# Patient Record
Sex: Male | Born: 1935 | Race: White | Hispanic: No | Smoking: Former smoker
Health system: Southern US, Community
[De-identification: ages and names within clinical notes are randomized; demographics above are authoritative.]

## PROBLEM LIST (undated history)

## (undated) DIAGNOSIS — I70209 Unspecified atherosclerosis of native arteries of extremities, unspecified extremity: Secondary | ICD-10-CM

## (undated) DIAGNOSIS — Z87891 Personal history of nicotine dependence: Secondary | ICD-10-CM

## (undated) DIAGNOSIS — I10 Essential (primary) hypertension: Secondary | ICD-10-CM

## (undated) DIAGNOSIS — I6529 Occlusion and stenosis of unspecified carotid artery: Secondary | ICD-10-CM

## (undated) DIAGNOSIS — R9431 Abnormal electrocardiogram [ECG] [EKG]: Secondary | ICD-10-CM

## (undated) DIAGNOSIS — N400 Enlarged prostate without lower urinary tract symptoms: Secondary | ICD-10-CM

## (undated) DIAGNOSIS — I4510 Unspecified right bundle-branch block: Secondary | ICD-10-CM

## (undated) DIAGNOSIS — I350 Nonrheumatic aortic (valve) stenosis: Secondary | ICD-10-CM

## (undated) DIAGNOSIS — S83289A Other tear of lateral meniscus, current injury, unspecified knee, initial encounter: Secondary | ICD-10-CM

## (undated) DIAGNOSIS — E7800 Pure hypercholesterolemia, unspecified: Secondary | ICD-10-CM

## (undated) DIAGNOSIS — D72819 Decreased white blood cell count, unspecified: Secondary | ICD-10-CM

## (undated) DIAGNOSIS — E785 Hyperlipidemia, unspecified: Secondary | ICD-10-CM

## (undated) DIAGNOSIS — N200 Calculus of kidney: Secondary | ICD-10-CM

## (undated) DIAGNOSIS — I351 Nonrheumatic aortic (valve) insufficiency: Secondary | ICD-10-CM

## (undated) DIAGNOSIS — R011 Cardiac murmur, unspecified: Secondary | ICD-10-CM

## (undated) HISTORY — DX: Benign prostatic hyperplasia without lower urinary tract symptoms: N40.0

## (undated) HISTORY — DX: Calculus of kidney: N20.0

## (undated) HISTORY — DX: Cardiac murmur, unspecified: R01.1

## (undated) HISTORY — DX: Nonrheumatic aortic (valve) insufficiency: I35.1

## (undated) HISTORY — DX: Unspecified right bundle-branch block: I45.10

## (undated) HISTORY — DX: Pure hypercholesterolemia, unspecified: E78.00

## (undated) HISTORY — DX: Nonrheumatic aortic (valve) stenosis: I35.0

## (undated) HISTORY — DX: Hyperlipidemia, unspecified: E78.5

## (undated) HISTORY — DX: Other tear of lateral meniscus, current injury, unspecified knee, initial encounter: S83.289A

## (undated) HISTORY — DX: Abnormal electrocardiogram (ECG) (EKG): R94.31

## (undated) HISTORY — DX: Decreased white blood cell count, unspecified: D72.819

## (undated) HISTORY — DX: Occlusion and stenosis of unspecified carotid artery: I65.29

## (undated) HISTORY — DX: Personal history of nicotine dependence: Z87.891

## (undated) HISTORY — DX: Unspecified atherosclerosis of native arteries of extremities, unspecified extremity: I70.209

---

## 2002-05-22 ENCOUNTER — Emergency Department (HOSPITAL_COMMUNITY): Admission: EM | Admit: 2002-05-22 | Discharge: 2002-05-22 | Payer: Self-pay | Admitting: Emergency Medicine

## 2002-05-22 ENCOUNTER — Encounter: Payer: Self-pay | Admitting: Emergency Medicine

## 2013-01-07 ENCOUNTER — Other Ambulatory Visit: Payer: Self-pay | Admitting: Endocrinology

## 2013-01-12 ENCOUNTER — Ambulatory Visit
Admission: RE | Admit: 2013-01-12 | Discharge: 2013-01-12 | Disposition: A | Discharge status: Home / Self Care | Payer: Medicare Other | Source: Ambulatory Visit | Attending: Endocrinology | Admitting: Endocrinology

## 2015-03-07 DIAGNOSIS — E7800 Pure hypercholesterolemia, unspecified: Secondary | ICD-10-CM | POA: Diagnosis not present

## 2015-03-07 DIAGNOSIS — Z0000 Encounter for general adult medical examination without abnormal findings: Secondary | ICD-10-CM | POA: Diagnosis not present

## 2015-03-07 DIAGNOSIS — Z125 Encounter for screening for malignant neoplasm of prostate: Secondary | ICD-10-CM | POA: Diagnosis not present

## 2015-03-10 DIAGNOSIS — I6529 Occlusion and stenosis of unspecified carotid artery: Secondary | ICD-10-CM | POA: Diagnosis not present

## 2015-03-10 DIAGNOSIS — I351 Nonrheumatic aortic (valve) insufficiency: Secondary | ICD-10-CM | POA: Diagnosis not present

## 2015-03-10 DIAGNOSIS — Z7982 Long term (current) use of aspirin: Secondary | ICD-10-CM | POA: Diagnosis not present

## 2015-03-10 DIAGNOSIS — D72819 Decreased white blood cell count, unspecified: Secondary | ICD-10-CM | POA: Diagnosis not present

## 2015-03-10 DIAGNOSIS — R0989 Other specified symptoms and signs involving the circulatory and respiratory systems: Secondary | ICD-10-CM | POA: Diagnosis not present

## 2015-03-29 ENCOUNTER — Other Ambulatory Visit: Payer: Self-pay | Admitting: Internal Medicine

## 2015-03-29 DIAGNOSIS — I6529 Occlusion and stenosis of unspecified carotid artery: Secondary | ICD-10-CM

## 2015-04-12 DIAGNOSIS — I6529 Occlusion and stenosis of unspecified carotid artery: Secondary | ICD-10-CM | POA: Diagnosis not present

## 2015-04-12 DIAGNOSIS — E7800 Pure hypercholesterolemia, unspecified: Secondary | ICD-10-CM | POA: Diagnosis not present

## 2015-04-12 DIAGNOSIS — I358 Other nonrheumatic aortic valve disorders: Secondary | ICD-10-CM | POA: Diagnosis not present

## 2015-04-20 ENCOUNTER — Other Ambulatory Visit: Payer: Self-pay

## 2015-04-21 DIAGNOSIS — I358 Other nonrheumatic aortic valve disorders: Secondary | ICD-10-CM | POA: Diagnosis not present

## 2015-04-25 DIAGNOSIS — E7800 Pure hypercholesterolemia, unspecified: Secondary | ICD-10-CM | POA: Diagnosis not present

## 2015-04-25 DIAGNOSIS — I2510 Atherosclerotic heart disease of native coronary artery without angina pectoris: Secondary | ICD-10-CM | POA: Diagnosis not present

## 2015-05-03 DIAGNOSIS — I6529 Occlusion and stenosis of unspecified carotid artery: Secondary | ICD-10-CM | POA: Diagnosis not present

## 2015-05-03 DIAGNOSIS — I351 Nonrheumatic aortic (valve) insufficiency: Secondary | ICD-10-CM | POA: Diagnosis not present

## 2015-05-03 DIAGNOSIS — E7800 Pure hypercholesterolemia, unspecified: Secondary | ICD-10-CM | POA: Diagnosis not present

## 2015-05-03 DIAGNOSIS — I350 Nonrheumatic aortic (valve) stenosis: Secondary | ICD-10-CM | POA: Diagnosis not present

## 2015-05-26 IMAGING — US US CAROTID DUPLEX BILAT
1 series · 13 of 24 positions shown · non-contrast
Comparison: None.

CLINICAL DATA: Carotid atherosclerosis

EXAM:
BILATERAL CAROTID DUPLEX ULTRASOUND
TECHNIQUE: Gray scale imaging, color Doppler and duplex ultrasound were
performed of bilateral carotid and vertebral arteries in the neck.

[Series 1: us carotid duplex bilat · 0.07mm/px · 13 of 71 slices shown]
[im 1/71]
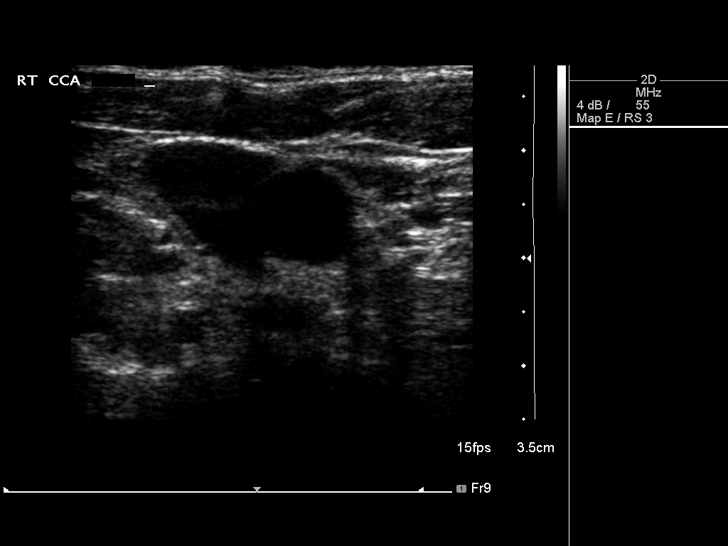
[im 7/71]
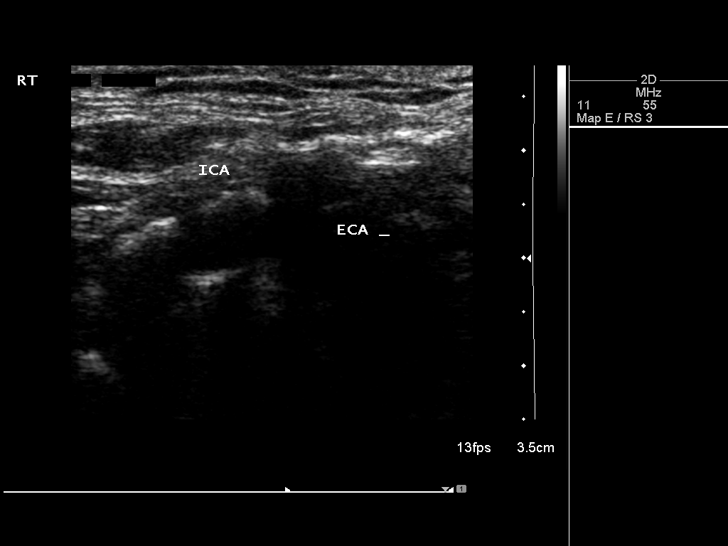
[im 13/71]
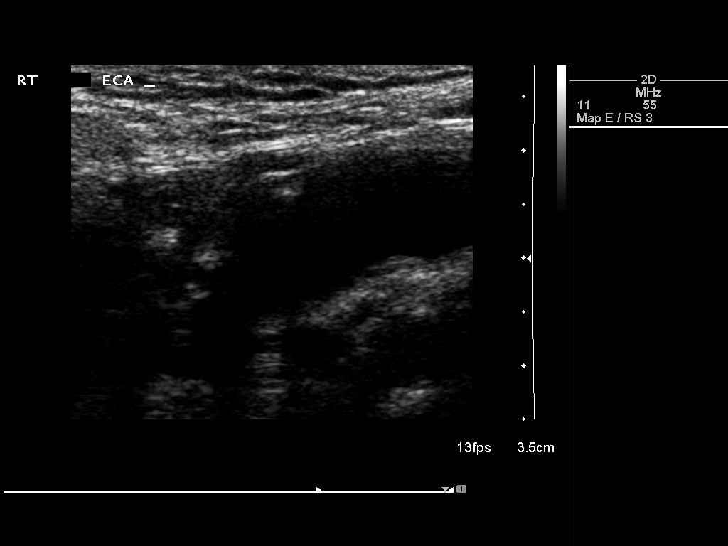
[im 19/71]
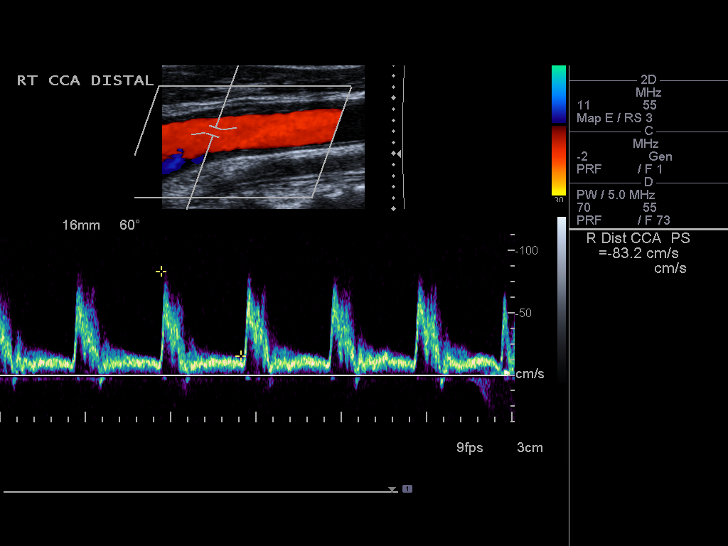
[im 25/71]
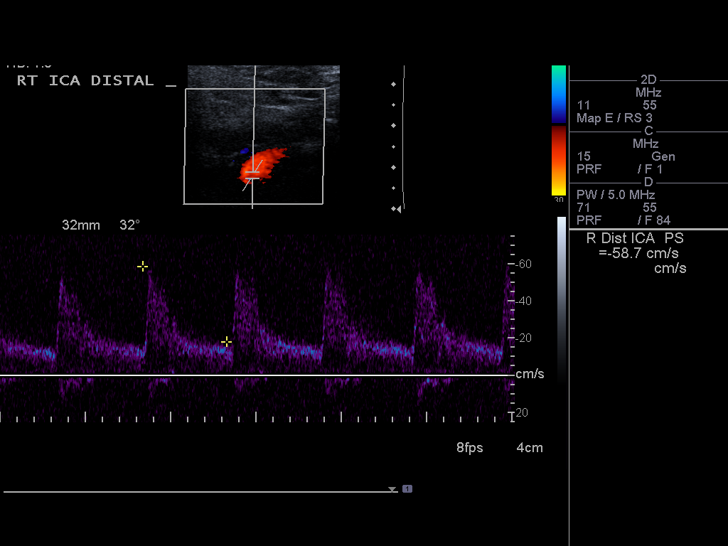
[im 31/71]
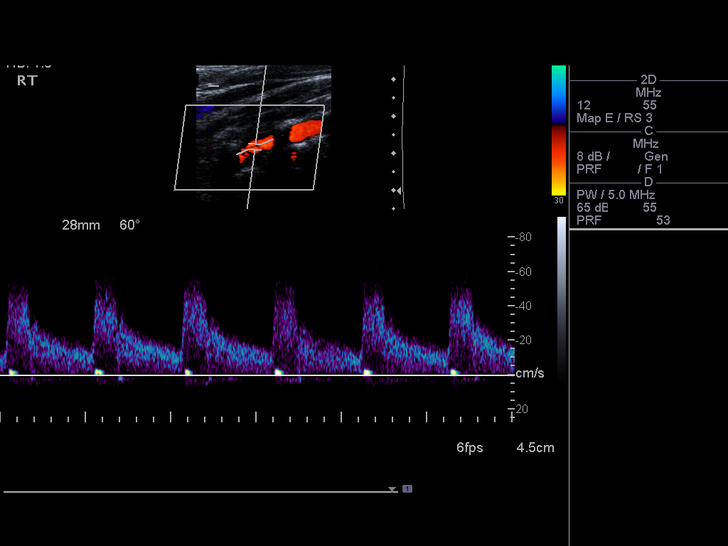
[im 37/71]
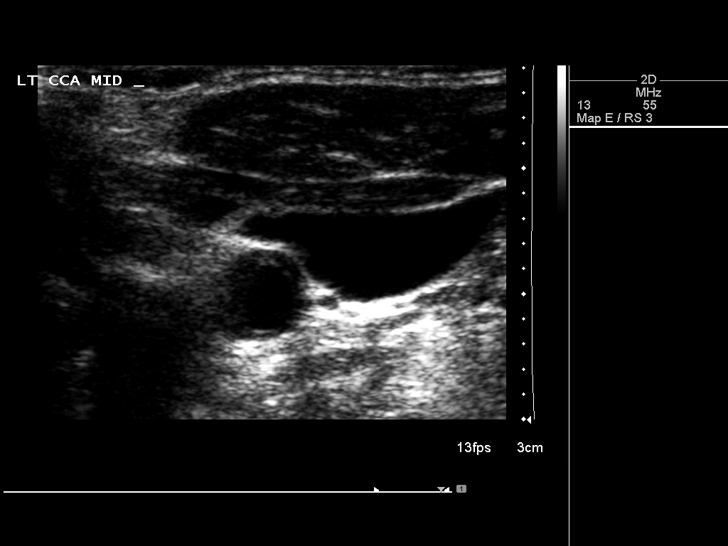
[im 40/71]
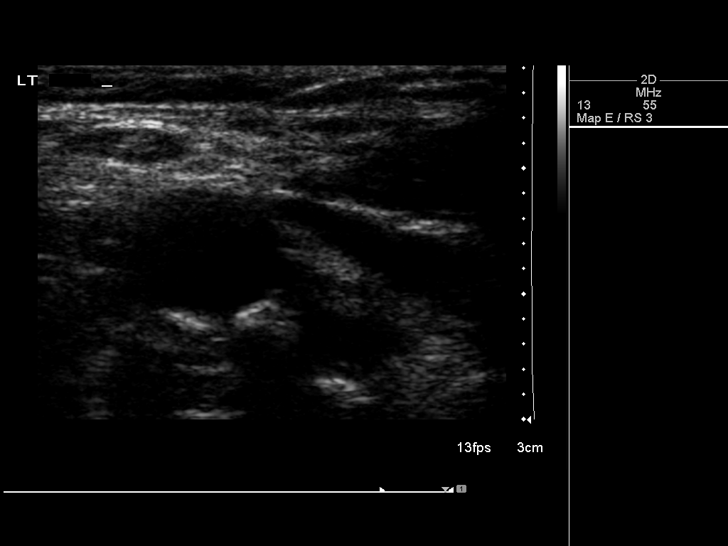
[im 46/71]
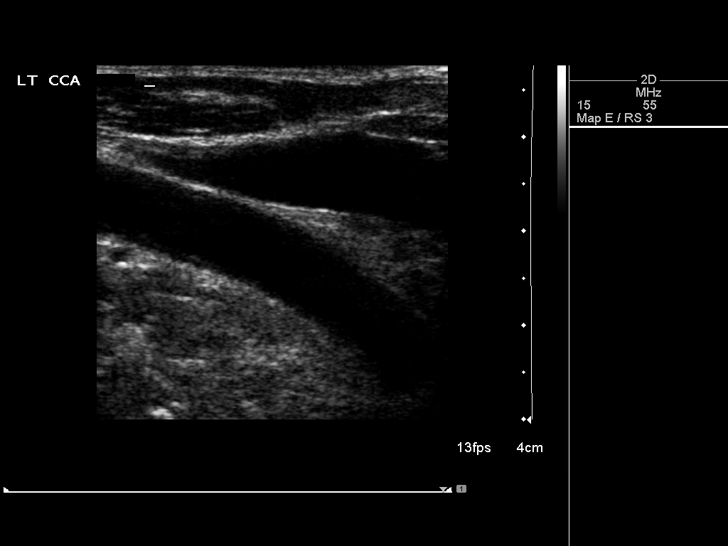
[im 52/71]
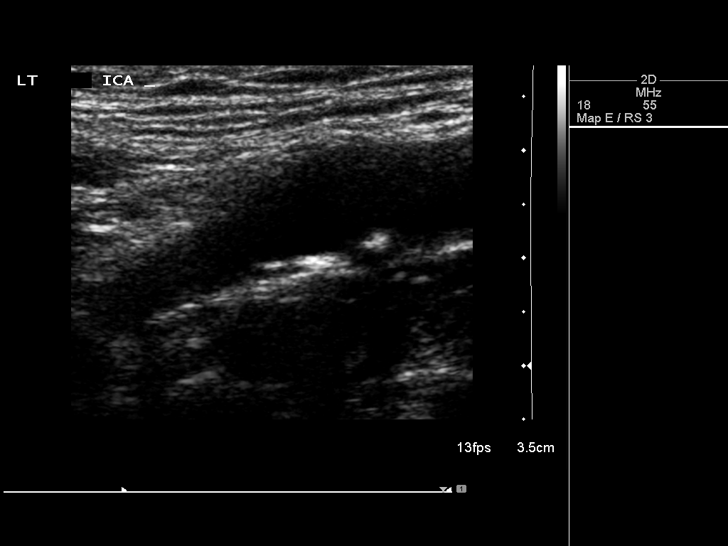
[im 58/71]
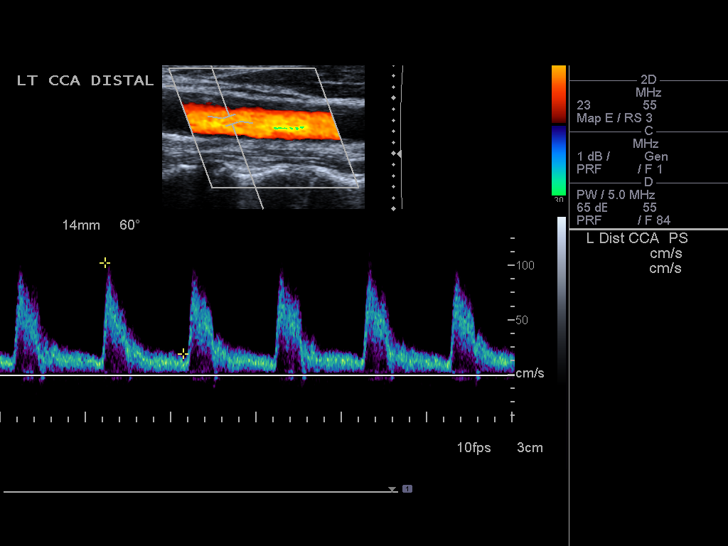
[im 64/71]
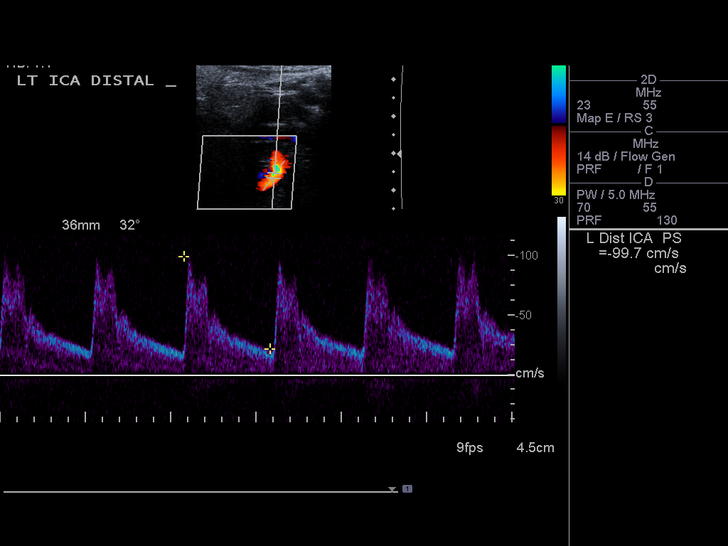
[im 71/71]
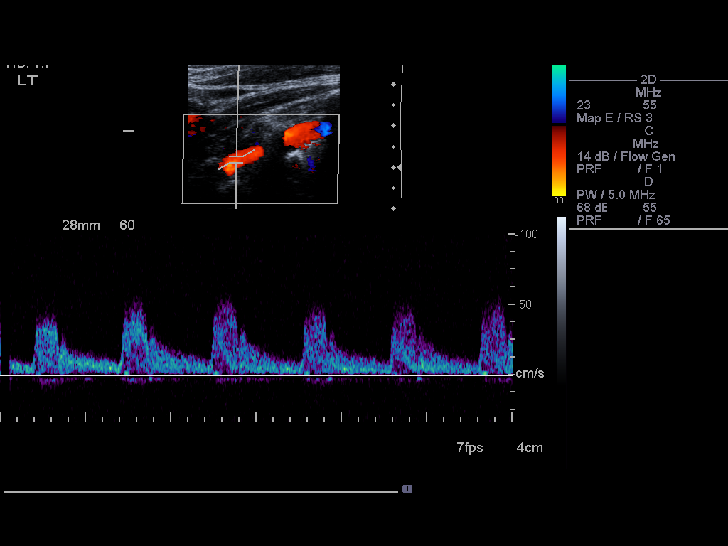

[13 of 24 positions shown; findings below may reference images not displayed]

FINDINGS: Criteria: Quantification of carotid stenosis is based on velocity
parameters that correlate the residual internal carotid diameter
with NASCET-based stenosis levels, using the diameter of the distal
internal carotid lumen as the denominator for stenosis measurement.

The following velocity measurements were obtained:

RIGHT

ICA:  106 cm/sec

CCA:  104 cm/sec

SYSTOLIC ICA/CCA RATIO:

DIASTOLIC ICA/CCA RATIO:

ECA:  99 cm/sec

LEFT

ICA:  105 cm/sec

CCA:  103 cm/sec

SYSTOLIC ICA/CCA RATIO:

DIASTOLIC ICA/CCA RATIO:

ECA:  129 cm/sec

RIGHT CAROTID ARTERY: Little if any plaque in the bulb. Low
resistance internal carotid Doppler pattern.

RIGHT VERTEBRAL ARTERY:  Antegrade. Low resistance Doppler pattern.

LEFT CAROTID ARTERY: Minimal calcified plaque in the bulb. Low
resistance internal carotid Doppler pattern.

LEFT VERTEBRAL ARTERY:  Antegrade. Normal Doppler waveform.
IMPRESSION: Less than 50% stenosis in the right and left internal carotid
arteries.

## 2015-12-02 DIAGNOSIS — I10 Essential (primary) hypertension: Secondary | ICD-10-CM | POA: Diagnosis not present

## 2015-12-02 DIAGNOSIS — E7800 Pure hypercholesterolemia, unspecified: Secondary | ICD-10-CM | POA: Diagnosis not present

## 2015-12-02 DIAGNOSIS — I6529 Occlusion and stenosis of unspecified carotid artery: Secondary | ICD-10-CM | POA: Diagnosis not present

## 2015-12-02 DIAGNOSIS — I350 Nonrheumatic aortic (valve) stenosis: Secondary | ICD-10-CM | POA: Diagnosis not present

## 2016-03-14 DIAGNOSIS — Z7982 Long term (current) use of aspirin: Secondary | ICD-10-CM | POA: Diagnosis not present

## 2016-03-14 DIAGNOSIS — Z125 Encounter for screening for malignant neoplasm of prostate: Secondary | ICD-10-CM | POA: Diagnosis not present

## 2016-03-14 DIAGNOSIS — D72819 Decreased white blood cell count, unspecified: Secondary | ICD-10-CM | POA: Diagnosis not present

## 2016-03-14 DIAGNOSIS — E7800 Pure hypercholesterolemia, unspecified: Secondary | ICD-10-CM | POA: Diagnosis not present

## 2016-03-15 DIAGNOSIS — N400 Enlarged prostate without lower urinary tract symptoms: Secondary | ICD-10-CM | POA: Diagnosis not present

## 2016-03-15 DIAGNOSIS — E7800 Pure hypercholesterolemia, unspecified: Secondary | ICD-10-CM | POA: Diagnosis not present

## 2016-03-15 DIAGNOSIS — Z7982 Long term (current) use of aspirin: Secondary | ICD-10-CM | POA: Diagnosis not present

## 2016-03-15 DIAGNOSIS — Z0001 Encounter for general adult medical examination with abnormal findings: Secondary | ICD-10-CM | POA: Diagnosis not present

## 2016-05-22 ENCOUNTER — Emergency Department (HOSPITAL_COMMUNITY)
Admission: EM | Admit: 2016-05-22 | Discharge: 2016-05-22 | Disposition: A | Discharge status: Home / Self Care | Payer: Medicare Other | Attending: Emergency Medicine | Admitting: Emergency Medicine

## 2016-05-22 ENCOUNTER — Encounter (HOSPITAL_COMMUNITY): Payer: Self-pay | Admitting: Emergency Medicine

## 2016-05-22 DIAGNOSIS — R404 Transient alteration of awareness: Secondary | ICD-10-CM | POA: Diagnosis not present

## 2016-05-22 DIAGNOSIS — Z7982 Long term (current) use of aspirin: Secondary | ICD-10-CM | POA: Diagnosis not present

## 2016-05-22 DIAGNOSIS — I6529 Occlusion and stenosis of unspecified carotid artery: Secondary | ICD-10-CM | POA: Diagnosis not present

## 2016-05-22 DIAGNOSIS — Z87891 Personal history of nicotine dependence: Secondary | ICD-10-CM | POA: Diagnosis not present

## 2016-05-22 DIAGNOSIS — I350 Nonrheumatic aortic (valve) stenosis: Secondary | ICD-10-CM | POA: Diagnosis not present

## 2016-05-22 DIAGNOSIS — I10 Essential (primary) hypertension: Secondary | ICD-10-CM | POA: Insufficient documentation

## 2016-05-22 DIAGNOSIS — R55 Syncope and collapse: Secondary | ICD-10-CM | POA: Diagnosis not present

## 2016-05-22 DIAGNOSIS — I351 Nonrheumatic aortic (valve) insufficiency: Secondary | ICD-10-CM | POA: Diagnosis not present

## 2016-05-22 HISTORY — DX: Essential (primary) hypertension: I10

## 2016-05-22 LAB — I-STAT CHEM 8, ED
BUN: 25 mg/dL — ABNORMAL HIGH (ref 6–20)
Calcium, Ion: 1.09 mmol/L — ABNORMAL LOW (ref 1.15–1.40)
Chloride: 101 mmol/L (ref 101–111)
Creatinine, Ser: 1.3 mg/dL — ABNORMAL HIGH (ref 0.61–1.24)
Glucose, Bld: 101 mg/dL — ABNORMAL HIGH (ref 65–99)
HCT: 39 % (ref 39.0–52.0)
Hemoglobin: 13.3 g/dL (ref 13.0–17.0)
Potassium: 4.1 mmol/L (ref 3.5–5.1)
Sodium: 138 mmol/L (ref 135–145)
TCO2: 23 mmol/L (ref 0–100)

## 2016-05-22 NOTE — ED Notes (Signed)
Meal given

## 2016-05-22 NOTE — ED Provider Notes (Signed)
[facility] DEPT Provider Note   CSN: [id number] Arrival date & time: 05/22/16  1317     History   Chief Complaint Chief Complaint  Patient presents with  . Loss of Consciousness    HPI Dwayne [name] is a 82 y.o. male.  82 yo M with a chief complaint of a syncopal event. Patient states he was standing in line waiting for the data transferred from his old wound to his new phone when he suddenly felt like the room was closing in on him and he collapsed to the ground. He denies any injury in the fall. Now he feels fine. Was initially hypotensive on the scene. Patient denies history of heart failure denies recent illness. Denies chest pain shortness of breath headache.   The history is provided by the patient.  Loss of Consciousness   This is a new problem. The current episode started less than 1 hour ago. The problem occurs constantly. He lost consciousness for a period of less than one minute. The problem is associated with normal activity. Pertinent negatives include abdominal pain, chest pain, confusion, congestion, fever, headaches, palpitations and vomiting. He has tried nothing for the symptoms. The treatment provided no relief.    Past Medical History:  Diagnosis Date  . Hypertension     There are no active problems to display for this patient.   History reviewed. No pertinent surgical history.     Home Medications    Prior to Admission medications   Medication Sig Start Date End Date Taking? Authorizing Provider  aspirin 81 MG chewable tablet Chew 81 mg by mouth daily.   Yes Historical Provider, MD  lisinopril (PRINIVIL,ZESTRIL) 5 MG tablet Take 5 mg by mouth daily.   Yes Historical Provider, MD    Family History No family history on file.  Social History Social History  Substance Use Topics  . Smoking status: Former Smoker    Quit date: 05/22/1968  . Smokeless tobacco: Current User    Types: Chew  . Alcohol use No     Allergies   Patient has no  known allergies.   Review of Systems Review of Systems  Constitutional: Negative for chills and fever.  HENT: Negative for congestion and facial swelling.   Eyes: Negative for discharge and visual disturbance.  Respiratory: Negative for shortness of breath.   Cardiovascular: Positive for syncope. Negative for chest pain and palpitations.  Gastrointestinal: Negative for abdominal pain, diarrhea and vomiting.  Musculoskeletal: Negative for arthralgias and myalgias.  Skin: Negative for color change and rash.  Neurological: Positive for syncope. Negative for tremors and headaches.  Psychiatric/Behavioral: Negative for confusion and dysphoric mood.     Physical Exam Updated Vital Signs BP 128/67   Pulse 66   Temp 98.1 F (36.7 C) (Oral)   Resp 18   Ht 5\' 11"  (1.803 m)   Wt 175 lb (79.4 kg)   SpO2 97%   BMI 24.41 kg/m   Physical Exam  Constitutional: He is oriented to person, place, and time. He appears well-developed and well-nourished.  HENT:  Head: Normocephalic and atraumatic.  Eyes: EOM are normal. Pupils are equal, round, and reactive to light.  Neck: Normal range of motion. Neck supple. No JVD present.  Cardiovascular: Normal rate and regular rhythm.  Exam reveals no gallop and no friction rub.   Murmur (systolic ejection murmur best heard at the mitral pole) heard. Pulmonary/Chest: No respiratory distress. He has no wheezes.  Abdominal: He exhibits no distension and no mass.  There is no tenderness. There is no rebound and no guarding.  Musculoskeletal: Normal range of motion.  Neurological: He is alert and oriented to person, place, and time.  Skin: No rash noted. No pallor.  Psychiatric: He has a normal mood and affect. His behavior is normal.  Nursing note and vitals reviewed.    ED Treatments / Results  Labs (all labs ordered are listed, but only abnormal results are displayed) Labs Reviewed  I-STAT CHEM 8, ED - Abnormal; Notable for the following:        Result Value   BUN 25 (*)    Creatinine, Ser 1.30 (*)    Glucose, Bld 101 (*)    Calcium, Ion 1.09 (*)    All other components within normal limits    EKG  EKG Interpretation None       Radiology No results found.  Procedures Procedures (including critical care time)  Medications Ordered in ED Medications - No data to display   Initial Impression / Assessment and Plan / ED Course  I have reviewed the triage vital signs and the nursing notes.  Pertinent labs & imaging results that were available during my care of the patient were reviewed by me and considered in my medical decision making (see chart for details).     82 yo M With a chief complaint of a syncopal event. Vasovagal by history. Patient incidentally had a echocardiogram done today. I discussed this with Dr. Einar Gip, there was able to look at it and has no change in his degree of aortic stenosis. As this was not exertional and did not feel that he needs admission to the hospital for emergent repair. I discussed this with the patient. He will follow-up with his cardiologist.  4:40 PM:  I have discussed the diagnosis/risks/treatment options with the patient and family and believe the pt to be eligible for discharge home to follow-up with Cards. We also discussed returning to the ED immediately if new or worsening sx occur. We discussed the sx which are most concerning (e.g., recurrent event, chest pain or shortness breath on exertion.) that necessitate immediate return. Medications administered to the patient during their visit and any new prescriptions provided to the patient are listed below.  Medications given during this visit Medications - No data to display   The patient appears reasonably screen and/or stabilized for discharge and I doubt any other medical condition or other [facility] requiring further screening, evaluation, or treatment in the ED at this time prior to discharge.    Final Clinical Impressions(s) / ED  Diagnoses   Final diagnoses:  Syncope and collapse    New Prescriptions Discharge Medication List as of 05/22/2016  3:46 PM       Deno Etienne, DO 05/22/16 1641

## 2016-05-22 NOTE — Discharge Instructions (Signed)
Follow up with your cardiologist  

## 2016-06-12 DIAGNOSIS — I6529 Occlusion and stenosis of unspecified carotid artery: Secondary | ICD-10-CM | POA: Diagnosis not present

## 2016-06-12 DIAGNOSIS — I10 Essential (primary) hypertension: Secondary | ICD-10-CM | POA: Diagnosis not present

## 2016-06-12 DIAGNOSIS — I350 Nonrheumatic aortic (valve) stenosis: Secondary | ICD-10-CM | POA: Diagnosis not present

## 2016-06-12 DIAGNOSIS — E7800 Pure hypercholesterolemia, unspecified: Secondary | ICD-10-CM | POA: Diagnosis not present

## 2017-03-15 DIAGNOSIS — E7800 Pure hypercholesterolemia, unspecified: Secondary | ICD-10-CM | POA: Diagnosis not present

## 2017-03-15 DIAGNOSIS — Z0000 Encounter for general adult medical examination without abnormal findings: Secondary | ICD-10-CM | POA: Diagnosis not present

## 2017-03-20 DIAGNOSIS — I351 Nonrheumatic aortic (valve) insufficiency: Secondary | ICD-10-CM | POA: Diagnosis not present

## 2017-03-20 DIAGNOSIS — N183 Chronic kidney disease, stage 3 (moderate): Secondary | ICD-10-CM | POA: Diagnosis not present

## 2017-03-20 DIAGNOSIS — N400 Enlarged prostate without lower urinary tract symptoms: Secondary | ICD-10-CM | POA: Diagnosis not present

## 2017-03-20 DIAGNOSIS — R0989 Other specified symptoms and signs involving the circulatory and respiratory systems: Secondary | ICD-10-CM | POA: Diagnosis not present

## 2017-03-20 DIAGNOSIS — Z0001 Encounter for general adult medical examination with abnormal findings: Secondary | ICD-10-CM | POA: Diagnosis not present

## 2017-03-20 DIAGNOSIS — E7800 Pure hypercholesterolemia, unspecified: Secondary | ICD-10-CM | POA: Diagnosis not present

## 2017-03-20 DIAGNOSIS — I6529 Occlusion and stenosis of unspecified carotid artery: Secondary | ICD-10-CM | POA: Diagnosis not present

## 2017-03-20 DIAGNOSIS — Z1212 Encounter for screening for malignant neoplasm of rectum: Secondary | ICD-10-CM | POA: Diagnosis not present

## 2017-07-09 DIAGNOSIS — I6529 Occlusion and stenosis of unspecified carotid artery: Secondary | ICD-10-CM | POA: Diagnosis not present

## 2017-07-09 DIAGNOSIS — E7800 Pure hypercholesterolemia, unspecified: Secondary | ICD-10-CM | POA: Diagnosis not present

## 2017-07-09 DIAGNOSIS — I10 Essential (primary) hypertension: Secondary | ICD-10-CM | POA: Diagnosis not present

## 2017-07-09 DIAGNOSIS — I350 Nonrheumatic aortic (valve) stenosis: Secondary | ICD-10-CM | POA: Diagnosis not present

## 2017-08-01 DIAGNOSIS — I350 Nonrheumatic aortic (valve) stenosis: Secondary | ICD-10-CM | POA: Diagnosis not present

## 2017-08-01 DIAGNOSIS — I351 Nonrheumatic aortic (valve) insufficiency: Secondary | ICD-10-CM | POA: Diagnosis not present

## 2018-01-07 DIAGNOSIS — I350 Nonrheumatic aortic (valve) stenosis: Secondary | ICD-10-CM | POA: Diagnosis not present

## 2018-01-07 DIAGNOSIS — I10 Essential (primary) hypertension: Secondary | ICD-10-CM | POA: Diagnosis not present

## 2018-01-07 DIAGNOSIS — I6529 Occlusion and stenosis of unspecified carotid artery: Secondary | ICD-10-CM | POA: Diagnosis not present

## 2018-01-07 DIAGNOSIS — E7800 Pure hypercholesterolemia, unspecified: Secondary | ICD-10-CM | POA: Diagnosis not present

## 2018-05-22 ENCOUNTER — Other Ambulatory Visit: Payer: Self-pay | Admitting: Cardiology

## 2018-05-22 DIAGNOSIS — I350 Nonrheumatic aortic (valve) stenosis: Secondary | ICD-10-CM

## 2018-05-22 DIAGNOSIS — E7800 Pure hypercholesterolemia, unspecified: Secondary | ICD-10-CM | POA: Diagnosis not present

## 2018-05-22 DIAGNOSIS — Z7982 Long term (current) use of aspirin: Secondary | ICD-10-CM | POA: Diagnosis not present

## 2018-05-22 DIAGNOSIS — I351 Nonrheumatic aortic (valve) insufficiency: Secondary | ICD-10-CM

## 2018-05-29 DIAGNOSIS — Z0000 Encounter for general adult medical examination without abnormal findings: Secondary | ICD-10-CM | POA: Diagnosis not present

## 2018-05-29 DIAGNOSIS — I517 Cardiomegaly: Secondary | ICD-10-CM | POA: Diagnosis not present

## 2018-06-06 ENCOUNTER — Encounter: Payer: Self-pay | Admitting: Gastroenterology

## 2018-07-07 ENCOUNTER — Ambulatory Visit: Payer: Self-pay | Admitting: Gastroenterology

## 2018-07-08 ENCOUNTER — Other Ambulatory Visit: Payer: Self-pay

## 2018-07-14 ENCOUNTER — Ambulatory Visit: Payer: Self-pay | Admitting: Cardiology

## 2018-08-07 ENCOUNTER — Other Ambulatory Visit: Payer: Medicare Other

## 2018-08-19 ENCOUNTER — Ambulatory Visit: Payer: Self-pay | Admitting: Cardiology

## 2018-09-24 ENCOUNTER — Ambulatory Visit (INDEPENDENT_AMBULATORY_CARE_PROVIDER_SITE_OTHER): Payer: Medicare Other

## 2018-09-24 ENCOUNTER — Other Ambulatory Visit: Payer: Self-pay

## 2018-09-24 DIAGNOSIS — I351 Nonrheumatic aortic (valve) insufficiency: Secondary | ICD-10-CM | POA: Diagnosis not present

## 2018-09-24 DIAGNOSIS — I350 Nonrheumatic aortic (valve) stenosis: Secondary | ICD-10-CM

## 2018-09-29 ENCOUNTER — Ambulatory Visit (INDEPENDENT_AMBULATORY_CARE_PROVIDER_SITE_OTHER): Payer: Medicare Other | Admitting: Cardiology

## 2018-09-29 ENCOUNTER — Encounter: Payer: Self-pay | Admitting: Cardiology

## 2018-09-29 ENCOUNTER — Other Ambulatory Visit: Payer: Self-pay

## 2018-09-29 DIAGNOSIS — E7800 Pure hypercholesterolemia, unspecified: Secondary | ICD-10-CM | POA: Insufficient documentation

## 2018-09-29 DIAGNOSIS — I6523 Occlusion and stenosis of bilateral carotid arteries: Secondary | ICD-10-CM | POA: Insufficient documentation

## 2018-09-29 DIAGNOSIS — I352 Nonrheumatic aortic (valve) stenosis with insufficiency: Secondary | ICD-10-CM | POA: Insufficient documentation

## 2018-09-29 DIAGNOSIS — I119 Hypertensive heart disease without heart failure: Secondary | ICD-10-CM | POA: Diagnosis not present

## 2018-09-29 NOTE — Progress Notes (Signed)
Subjective:  Primary Physician:  Deland Pretty, MD  Patient ID: Dwayne [name], male    DOB: [birth year], 84 y.o.   MRN: [id number]  This visit type was conducted due to national recommendations for restrictions regarding the COVID-19 Pandemic (e.g. social distancing).  This format is felt to be most appropriate for this patient at this time.  All issues noted in this document were discussed and addressed.  No physical exam was performed (except for noted visual exam findings with Telehealth visits - very limited).  The patient has consented to conduct a Telehealth visit and understands insurance will be billed.   I connected with patient, on 09/29/18  by telemedicine application and verified that I am speaking with the correct person using two identifiers.     I discussed the limitations of evaluation and management by telemedicine and the availability of in person appointments. The patient expressed understanding and agreed to proceed.   I have discussed with patient regarding the safety during COVID Pandemic and steps and precautions including social distancing with the patient.   Chief Complaint  Patient presents with  . Aortic valve stenosis  . Hypertension  . Follow-up    42mo   HPI: Dwayne [name] is a 84y.o. male, who presents for a Follow-up for Valvular heart disease. He has aortic stenosis and aortic regurgitation.  Patient denies any complaints of shortness of breath, orthopnea or PND. No complaints of chest pain, tightness or pressure. No history of swelling on the legs and no claudication. No complaints of palpitation, dizziness, near-syncope or syncope. No h/o fever.  Patient has hypertension, controlled with therapy. No h/o diabetes. He has hypercholesterolemia. Patient has intolerance to statins. He had tried several statins and had severe myalgias. Currently, he is on some research study medication for high cholesterol.  Patient also has mild carotid artery  atherosclerosis, No complaints of sudden weakness, numbness on any one side of the body, no visual symptoms and no other symptoms of TIA. Patient is complaining of increased bruising but there is no history of any other bleeding. No hematuria, GI bleed or epistaxis.  No history of thyroid problems. No history of TIA or CVA. He has been staying fairly active physically doing yard work, using [employer]  Past Medical History:  Diagnosis Date  . Aortic regurgitation   . Aortic stenosis   . Benign prostatic hyperplasia   . Chronic leukopenia   . Heart murmur   . Hyperlipidemia   . Hypertension     No past surgical history on file.  Social History   Socioeconomic History  . Marital status: Widowed    Spouse name: Not on file  . Number of children: 2  . Years of education: Not on file  . Highest education level: Not on file  Occupational History  . Not on file  Social Needs  . Financial resource strain: Not on file  . Food insecurity    Worry: Not on file    Inability: Not on file  . Transportation needs    Medical: Not on file    Non-medical: Not on file  Tobacco Use  . Smoking status: Former Smoker    Packs/day: 1.00    Years: 10.00    Pack years: 10.00    Quit date: 05/22/1970    Years since quitting: 48.3  . Smokeless tobacco: Current User    Types: Chew  Substance and Sexual Activity  . Alcohol use: No  .  Drug use: No  . Sexual activity: Not on file  Lifestyle  . Physical activity    Days per week: Not on file    Minutes per session: Not on file  . Stress: Not on file  Relationships  . Social Herbalist on phone: Not on file    Gets together: Not on file    Attends religious service: Not on file    Active member of club or organization: Not on file    Attends meetings of clubs or organizations: Not on file    Relationship status: Not on file  . Intimate partner violence    Fear of current or ex partner: Not on file    Emotionally abused: Not  on file    Physically abused: Not on file    Forced sexual activity: Not on file  Other Topics Concern  . Not on file  Social History Narrative  . Not on file    Current Outpatient Medications on File Prior to Visit  Medication Sig Dispense Refill  . aspirin 81 MG EC tablet Take 81 mg by mouth daily.     Marland Kitchen lisinopril (PRINIVIL,ZESTRIL) 5 MG tablet Take 5 mg by mouth daily.     No current facility-administered medications on file prior to visit.     Review of Systems  Constitutional: Negative for fever.  HENT: Negative for nosebleeds.   Eyes: Negative for blurred vision.  Respiratory: Negative for cough and shortness of breath.   Cardiovascular: Negative for chest pain, palpitations, claudication and leg swelling.  Gastrointestinal: Negative for abdominal pain, nausea and vomiting.  Genitourinary: Negative for dysuria.  Musculoskeletal: Negative for myalgias.  Skin: Negative for itching and rash.  Neurological: Negative for dizziness, seizures and loss of consciousness.  Psychiatric/Behavioral: The patient is not nervous/anxious.        Objective:  Height 5' 11"  (1.803 m), weight 175 lb (79.4 kg). Body mass index is 24.41 kg/m.  Physical Exam: Patient is alert and oriented, appeared very comfortable talking to me during the visit. No further detailed physical examination was possible as it was a telemedicine visit.   CARDIAC STUDIES:  Echocardiogram- 09/24/2018 1. Normal LV systolic function with EF 55%. Left ventricle cavity is normal in size. Moderate concentric hypertrophy of the left ventricle. Normal global wall motion. Doppler evidence of grade I (impaired) diastolic dysfunction, normal LAP. 2. Trileaflet aortic valve with moderate leaflet and annular calcification. Moderate to severe aortic valve stenosis. Aortic valve mean gradient of 27 mmHg, Vmax of 3.4 m/s. Calculated aortic valve area by continuity equation is 0.9 cm. Moderate (Grade III) aortic  regurgitation. 3. Mild tricuspid regurgitation. 4. No evidence of pulmonary hypertension. 5. No significant change compared to previous study on 05/22/2016.  Treadmill exercise stress test 04/25/2015: Indication: Screening for CAD. The patient exercised according to Bruce Protocol, Total time recorded 3.52 min achieving max heart rate of 133 which was 93 % of MPHR for age and 5.67 METS of work. Normal BP response. Resting EKG shows NSR, RBBB. There was no ST-T changes of ischemia with exercise stress test. Stress terminated due to THR (>85% MPHR)/MPHR met. Mildly reduced exercise tolerence. No arrhythmias. Arthritis and dyspnea limiting stress test.  Carotid artery duplex 05/22/2016: Minimal stenosis in the bilateral internal carotid artery (minimal) with mild mixed plaque through out the carotid arteries bilaterally. Antegrade vertebral artery flow. No significant change comparing outside report of Carotid Doppler- 01/12/2013  Assessment & Recommendations:     ICD-10-CM  1. Nonrheumatic aortic insufficiency with aortic stenosis  I35.2   2. Hypertension with heart disease  I11.9   3. Hypercholesterolemia  E78.00   4. Mild atherosclerosis of both carotid arteries  I65.23    Laboratory Exam:  CBC Latest Ref Rng & Units 05/22/2016  Hemoglobin 13.0 - 17.0 g/dL 13.3  Hematocrit 39.0 - 52.0 % 39.0   CMP Latest Ref Rng & Units 05/22/2016  Glucose 65 - 99 mg/dL 101(H)  BUN 6 - 20 mg/dL 25(H)  Creatinine 0.61 - 1.24 mg/dL 1.30(H)  Sodium 135 - 145 mmol/L 138  Potassium 3.5 - 5.1 mmol/L 4.1  Chloride 101 - 111 mmol/L 101   Lipid Panel  No results found for: CHOL, TRIG, HDL, CHOLHDL, VLDL, LDLCALC, LDLDIRECT   Recommendation:  Valvular heart disease is stable clinically. Echocardiogram findings were explained to the patient, no significant change from last year.No symptoms or signs of CHF. Blood pressure is controlled with therapy, he was advised to continue present medications.   Primary prevention was again discussed with him. He was advised to follow low-salt, low-cholesterol diet. He will continue research study medication for cholesterol and have blood tests with them and at PCP's office. Continue low-dose aspirin.  I will see him in follow-up in 6 months but call us earlier if there are any cardiac problems or BP is uncontrolled.   Despina Hick, MD, [facility] 09/29/2018, 12:14 PM [facility] [place] Pager: [phone] Office: [phone] If no answer Cell [phone]

## 2019-03-30 ENCOUNTER — Ambulatory Visit: Payer: Medicare Other | Admitting: Cardiology

## 2019-06-04 DIAGNOSIS — N183 Chronic kidney disease, stage 3 unspecified: Secondary | ICD-10-CM | POA: Diagnosis not present

## 2019-06-04 DIAGNOSIS — Z0001 Encounter for general adult medical examination with abnormal findings: Secondary | ICD-10-CM | POA: Diagnosis not present

## 2019-07-09 ENCOUNTER — Other Ambulatory Visit: Payer: Self-pay | Admitting: Cardiology

## 2019-07-09 DIAGNOSIS — I10 Essential (primary) hypertension: Secondary | ICD-10-CM

## 2019-10-03 ENCOUNTER — Other Ambulatory Visit: Payer: Self-pay | Admitting: Cardiology

## 2019-10-03 DIAGNOSIS — I10 Essential (primary) hypertension: Secondary | ICD-10-CM

## 2020-01-03 ENCOUNTER — Other Ambulatory Visit: Payer: Self-pay | Admitting: Cardiology

## 2020-01-03 DIAGNOSIS — I10 Essential (primary) hypertension: Secondary | ICD-10-CM

## 2020-04-07 ENCOUNTER — Other Ambulatory Visit: Payer: Self-pay | Admitting: Cardiology

## 2020-04-07 DIAGNOSIS — I10 Essential (primary) hypertension: Secondary | ICD-10-CM

## 2020-06-09 DIAGNOSIS — E7800 Pure hypercholesterolemia, unspecified: Secondary | ICD-10-CM | POA: Diagnosis not present

## 2020-06-16 DIAGNOSIS — I350 Nonrheumatic aortic (valve) stenosis: Secondary | ICD-10-CM | POA: Diagnosis not present

## 2020-06-16 DIAGNOSIS — Z0001 Encounter for general adult medical examination with abnormal findings: Secondary | ICD-10-CM | POA: Diagnosis not present

## 2020-06-16 DIAGNOSIS — E7800 Pure hypercholesterolemia, unspecified: Secondary | ICD-10-CM | POA: Diagnosis not present

## 2020-06-16 DIAGNOSIS — I10 Essential (primary) hypertension: Secondary | ICD-10-CM | POA: Diagnosis not present

## 2020-06-16 DIAGNOSIS — I6529 Occlusion and stenosis of unspecified carotid artery: Secondary | ICD-10-CM | POA: Diagnosis not present

## 2020-06-16 DIAGNOSIS — R7303 Prediabetes: Secondary | ICD-10-CM | POA: Diagnosis not present

## 2020-06-16 DIAGNOSIS — N1831 Chronic kidney disease, stage 3a: Secondary | ICD-10-CM | POA: Diagnosis not present

## 2020-06-16 DIAGNOSIS — I517 Cardiomegaly: Secondary | ICD-10-CM | POA: Diagnosis not present

## 2020-08-29 DIAGNOSIS — I517 Cardiomegaly: Secondary | ICD-10-CM | POA: Diagnosis not present

## 2020-08-29 DIAGNOSIS — R9431 Abnormal electrocardiogram [ECG] [EKG]: Secondary | ICD-10-CM | POA: Diagnosis not present

## 2020-08-29 DIAGNOSIS — I4510 Unspecified right bundle-branch block: Secondary | ICD-10-CM | POA: Diagnosis not present

## 2020-08-29 DIAGNOSIS — I351 Nonrheumatic aortic (valve) insufficiency: Secondary | ICD-10-CM | POA: Diagnosis not present

## 2020-08-29 DIAGNOSIS — I350 Nonrheumatic aortic (valve) stenosis: Secondary | ICD-10-CM | POA: Diagnosis not present

## 2020-08-29 DIAGNOSIS — Z7982 Long term (current) use of aspirin: Secondary | ICD-10-CM | POA: Diagnosis not present

## 2020-08-29 DIAGNOSIS — I6529 Occlusion and stenosis of unspecified carotid artery: Secondary | ICD-10-CM | POA: Diagnosis not present

## 2020-09-01 ENCOUNTER — Telehealth: Payer: Self-pay

## 2020-09-01 NOTE — Telephone Encounter (Signed)
NOTES ON FILE FROM [facility] [phone], SENT REFERRAL TO SCHEDULING 

## 2020-09-13 ENCOUNTER — Encounter (INDEPENDENT_AMBULATORY_CARE_PROVIDER_SITE_OTHER): Payer: Self-pay

## 2020-09-13 ENCOUNTER — Ambulatory Visit (INDEPENDENT_AMBULATORY_CARE_PROVIDER_SITE_OTHER): Payer: Medicare Other | Admitting: Cardiology

## 2020-09-13 ENCOUNTER — Other Ambulatory Visit: Payer: Self-pay

## 2020-09-13 ENCOUNTER — Encounter: Payer: Self-pay | Admitting: Cardiology

## 2020-09-13 VITALS — BP 140/70 | HR 67 | Ht 71.0 in | Wt 165.0 lb

## 2020-09-13 DIAGNOSIS — I352 Nonrheumatic aortic (valve) stenosis with insufficiency: Secondary | ICD-10-CM | POA: Diagnosis not present

## 2020-09-13 DIAGNOSIS — I119 Hypertensive heart disease without heart failure: Secondary | ICD-10-CM

## 2020-09-13 NOTE — Patient Instructions (Signed)
Medication Instructions:  The current medical regimen is effective;  continue present plan and medications.  *If you need a refill on your cardiac medications before your next appointment, please call your pharmacy*  Testing/Procedures: Your physician has requested that you have an echocardiogram. Echocardiography is a painless test that uses sound waves to create images of your heart. It provides your doctor with information about the size and shape of your heart and how well your heart's chambers and valves are working. This procedure takes approximately one hour. There are no restrictions for this procedure.  Follow-Up: At [facility], you and your health needs are our priority.  As part of our continuing mission to provide you with exceptional heart care, we have created designated Provider Care Teams.  These Care Teams include your primary Cardiologist (physician) and Advanced Practice Providers (APPs -  Physician Assistants and Nurse Practitioners) who all work together to provide you with the care you need, when you need it.  We recommend signing up for the patient portal called "MyChart".  Sign up information is provided on this After Visit Summary.  MyChart is used to connect with patients for Virtual Visits (Telemedicine).  Patients are able to view lab/test results, encounter notes, upcoming appointments, etc.  Non-urgent messages can be sent to your provider as well.   To learn more about what you can do with MyChart, go to [email or web address].    Your next appointment:   12 month(s)  The format for your next appointment:   In Person  Provider:   Mark Skains, MD   Thank you for choosing [facility]!!      

## 2020-09-13 NOTE — Progress Notes (Signed)
Cardiology Office Note:    Date:  09/13/2020   ID:  Dwayne [name], DOB 14-Jul-1934, MRN [id number]  PCP:  Deland Pretty, MD   [facility] Providers Cardiologist:  None     Referring MD: Deland Pretty, MD     History of Present Illness:    Dwayne [name] is a 86 y.o. male here for the evaluation of aortic stenosis and aortic regurgitation.  Overall not having any fevers chills nausea vomiting syncope bleeding shortness of breath chest pain.  Has had problems with statin intolerance.  Severe myalgias.  Currently in research study.  Has had mild carotid artery atherosclerosis in the past.  No symptoms of stroke.and a trial with bempedoic acid  Stays active doing yard work, Visual merchandiser. he has 5 acres of manicured grass.  5 other acres of pasture land.  Enjoys his four tractors.    Past Medical History:  Diagnosis Date  . Abnormal EKG   . Acute tear lateral meniscus   . Aortic regurgitation   . Aortic stenosis   . Atherosclerotic peripheral vascular disease ([place])   . Benign prostatic hyperplasia   . BPH (benign prostatic hyperplasia)   . Carotid atherosclerosis   . Chronic leukopenia   . Heart murmur   . History of smoking   . Hypercholesterolemia   . Hyperlipidemia   . Hypertension   . Mild aortic valve regurgitation   . Nephrolithiasis   . RBBB     No past surgical history on file.  Current Medications: Current Meds  Medication Sig  . aspirin 81 MG EC tablet Take 81 mg by mouth daily.   Marland Kitchen lisinopril (ZESTRIL) 10 MG tablet TAKE 1 TABLET BY MOUTH EVERY DAY     Allergies:   Crestor [rosuvastatin calcium], Lipitor [atorvastatin calcium], and Trazodone and nefazodone   Social History   Socioeconomic History  . Marital status: Widowed    Spouse name: Not on file  . Number of children: 2  . Years of education: Not on file  . Highest education level: Not on file  Occupational History  . Not on file  Tobacco Use  . Smoking status: Former Smoker     Packs/day: 1.00    Years: 10.00    Pack years: 10.00    Quit date: 05/22/1970    Years since quitting: 50.3  . Smokeless tobacco: Current User    Types: Chew  Substance and Sexual Activity  . Alcohol use: No  . Drug use: No  . Sexual activity: Not on file  Other Topics Concern  . Not on file  Social History Narrative  . Not on file   Social Determinants of Health   Financial Resource Strain: Not on file  Food Insecurity: Not on file  Transportation Needs: Not on file  Physical Activity: Not on file  Stress: Not on file  Social Connections: Not on file     Family History: The patient's family history includes Asthma in his mother; Cancer - Other in his father; Other in his father.  ROS:   Please see the history of present illness.     All other systems reviewed and are negative.  EKGs/Labs/Other Studies Reviewed:    The following studies were reviewed today:  Echocardiogram- 09/24/2018 1. Normal LV systolic function with EF 55%. Left ventricle cavity is normal in size. Moderate concentric hypertrophy of the left ventricle. Normal global wall motion. Doppler evidence of grade I (impaired) diastolic dysfunction, normal LAP. 2. Trileaflet aortic valve with  moderate leaflet and annular calcification. Moderate to severe aortic valve stenosis. Aortic valve mean gradient of 27 mmHg, Vmax of 3.4 m/s. Calculated aortic valve area by continuity equation is 0.9 cm. Moderate (Grade III) aortic regurgitation. 3. Mild tricuspid regurgitation. 4. No evidence of pulmonary hypertension. 5. No significant change compared to previous study on 05/22/2016.  Treadmill exercise stress test 04/25/2015: Indication: Screening for CAD. The patient exercised according to Bruce Protocol, Total time recorded 3.52 min achieving max heart rate of 133 which was 93 % of MPHR for age and 5.67 METS of work. Normal BP response. Resting EKG shows NSR, RBBB. There was no ST-T changes of ischemia  with exercise stress test. Stress terminated due to THR (>85% MPHR)/MPHR met. Mildly reduced exercise tolerence. No arrhythmias. Arthritis and dyspnea limiting stress test.  Carotid artery duplex 05/22/2016: Minimal stenosis in the bilateral internal carotid artery (minimal) with mild mixed plaque through out the carotid arteries bilaterally. Antegrade vertebral artery flow. No significant change comparing outside report of Carotid Doppler- 01/12/2013  EKG:  EKG is  ordered today.  The ekg ordered today demonstrates SR 67 bpm  Recent Labs: No results found for requested labs within last 8760 hours.  Recent Lipid Panel No results found for: CHOL, TRIG, HDL, CHOLHDL, VLDL, LDLCALC, LDLDIRECT   Risk Assessment/Calculations:      Physical Exam:    VS:  BP 140/70 (BP Location: Left Arm, Patient Position: Sitting, Cuff Size: Normal)   Pulse 67   Ht 5' 11"  (1.803 m)   Wt 165 lb (74.8 kg)   SpO2 95%   BMI 23.01 kg/m     Wt Readings from Last 3 Encounters:  09/13/20 165 lb (74.8 kg)  09/29/18 175 lb (79.4 kg)  05/22/16 175 lb (79.4 kg)     GEN:  Well nourished, well developed in no acute distress HEENT: Normal NECK: No JVD; No carotid bruits LYMPHATICS: No lymphadenopathy CARDIAC: RRR, 3/6 SM, rubs, gallops RESPIRATORY:  Clear to auscultation without rales, wheezing or rhonchi  ABDOMEN: Soft, non-tender, non-distended MUSCULOSKELETAL:  No edema; No deformity, mild bruises noted SKIN: Warm and dry NEUROLOGIC:  Alert and oriented x 3 PSYCHIATRIC:  Normal affect   ASSESSMENT:    1. Nonrheumatic aortic insufficiency with aortic stenosis   2. Hypertension with heart disease    PLAN:    In order of problems listed above:  Aortic stenosis/regurgitation -Previously described as moderate.  We will check echocardiogram here in our lab so that we were able to see images.  Not unreasonable to take low-dose aspirin.  Watch for any signs of bleeding.  Essential hypertension -  Under good control.  Usually 125/60 at home.  Slightly elevated today in the clinic setting which is not unusual.  Continue with lisinopril 10 mg a day for medical management.  Refills as needed.  Hyperlipidemia with statin intolerance - Just finished a 4-year research study with bempedoic acid.  Unsure whether he had placebo or not.  Hemoglobin 12.8 platelets 199 creatinine 1.42 sodium 141 ALT 18 potassium 4.3 total cholesterol 215 HDL 39 LDL 135 triglycerides 227   Medication Adjustments/Labs and Tests Ordered: Current medicines are reviewed at length with the patient today.  Concerns regarding medicines are outlined above.  Orders Placed This Encounter  Procedures  . EKG 12-Lead  . ECHOCARDIOGRAM COMPLETE   No orders of the defined types were placed in this encounter.   Patient Instructions  Medication Instructions:  The current medical regimen is effective;  continue present plan  and medications.  *If you need a refill on your cardiac medications before your next appointment, please call your pharmacy*  Testing/Procedures: Your physician has requested that you have an echocardiogram. Echocardiography is a painless test that uses sound waves to create images of your heart. It provides your doctor with information about the size and shape of your heart and how well your heart's chambers and valves are working. This procedure takes approximately one hour. There are no restrictions for this procedure.  Follow-Up: At [facility], you and your health needs are our priority.  As part of our continuing mission to provide you with exceptional heart care, we have created designated Provider Care Teams.  These Care Teams include your primary Cardiologist (physician) and Advanced Practice Providers (APPs -  Physician Assistants and Nurse Practitioners) who all work together to provide you with the care you need, when you need it.  We recommend signing up for the patient portal called  "MyChart".  Sign up information is provided on this After Visit Summary.  MyChart is used to connect with patients for Virtual Visits (Telemedicine).  Patients are able to view lab/test results, encounter notes, upcoming appointments, etc.  Non-urgent messages can be sent to your provider as well.   To learn more about what you can do with MyChart, go to [email or web address].    Your next appointment:   12 month(s)  The format for your next appointment:   In Person  Provider:   Candee Furbish, MD  Thank you for choosing [facility]!!        Signed, Candee Furbish, MD  09/13/2020 10:45 AM    [place]

## 2020-10-05 ENCOUNTER — Other Ambulatory Visit: Payer: Self-pay

## 2020-10-05 ENCOUNTER — Ambulatory Visit (HOSPITAL_COMMUNITY): Payer: Medicare Other | Attending: Internal Medicine

## 2020-10-05 DIAGNOSIS — I352 Nonrheumatic aortic (valve) stenosis with insufficiency: Secondary | ICD-10-CM | POA: Diagnosis not present

## 2020-10-05 LAB — ECHOCARDIOGRAM COMPLETE
AR max vel: 0.72 cm2
AV Area VTI: 0.81 cm2
AV Area mean vel: 0.71 cm2
AV Mean grad: 35 mmHg
AV Peak grad: 60.8 mmHg
Ao pk vel: 3.9 m/s
Area-P 1/2: 2.26 cm2
P 1/2 time: 413 msec
S' Lateral: 3.1 cm

## 2021-10-20 DIAGNOSIS — R7303 Prediabetes: Secondary | ICD-10-CM | POA: Diagnosis not present

## 2021-10-23 DIAGNOSIS — Z0000 Encounter for general adult medical examination without abnormal findings: Secondary | ICD-10-CM | POA: Diagnosis not present

## 2021-10-23 DIAGNOSIS — N1832 Chronic kidney disease, stage 3b: Secondary | ICD-10-CM | POA: Diagnosis not present

## 2021-10-23 DIAGNOSIS — I6529 Occlusion and stenosis of unspecified carotid artery: Secondary | ICD-10-CM | POA: Diagnosis not present

## 2021-10-23 DIAGNOSIS — D485 Neoplasm of uncertain behavior of skin: Secondary | ICD-10-CM | POA: Diagnosis not present

## 2021-10-23 DIAGNOSIS — I350 Nonrheumatic aortic (valve) stenosis: Secondary | ICD-10-CM | POA: Diagnosis not present

## 2021-10-23 DIAGNOSIS — I499 Cardiac arrhythmia, unspecified: Secondary | ICD-10-CM | POA: Diagnosis not present

## 2021-10-23 DIAGNOSIS — I4510 Unspecified right bundle-branch block: Secondary | ICD-10-CM | POA: Diagnosis not present

## 2021-10-23 DIAGNOSIS — R0989 Other specified symptoms and signs involving the circulatory and respiratory systems: Secondary | ICD-10-CM | POA: Diagnosis not present

## 2021-10-23 DIAGNOSIS — I10 Essential (primary) hypertension: Secondary | ICD-10-CM | POA: Diagnosis not present

## 2021-11-03 ENCOUNTER — Encounter: Payer: Self-pay | Admitting: Cardiology

## 2021-11-03 ENCOUNTER — Ambulatory Visit: Payer: Medicare Other | Admitting: Cardiology

## 2021-11-03 VITALS — BP 138/52 | HR 87 | Temp 97.8°F | Resp 16 | Ht 71.0 in | Wt 156.0 lb

## 2021-11-03 DIAGNOSIS — I352 Nonrheumatic aortic (valve) stenosis with insufficiency: Secondary | ICD-10-CM | POA: Diagnosis not present

## 2021-11-03 DIAGNOSIS — R0989 Other specified symptoms and signs involving the circulatory and respiratory systems: Secondary | ICD-10-CM | POA: Diagnosis not present

## 2021-11-03 DIAGNOSIS — I119 Hypertensive heart disease without heart failure: Secondary | ICD-10-CM

## 2021-11-03 NOTE — Progress Notes (Signed)
Patient referred by Deland Pretty, MD for aortic stenosis  Subjective:   Dwayne [name], male    DOB: 1934-07-21, 87 y.o.   MRN: [id number]   Chief Complaint  Patient presents with   Aortic Stenosis   New Patient (Initial Visit)     HPI  87 y.o. Caucasian male with hypertension, hyperlipidemia, aortic stenosis, carotid atherosclerosis, stage IIIb kidney disease, irregular heart rhythm  Patient was seen by Dr. Vear Clock in 2020, and then Dr. Candee Furbish in 2022.  Echocardiogram in June 2022 showed moderate aortic stenosis. Patient lives [place] [address] by himself. He is independent, his son lives 5 miles away. Patient is active, takes care of his 10 acre lawn, mows it regularly on a ride mower and tractor. He has not been walking much for last two weeks due to "twisted knee". He denies chest pain, shortness of breath, palpitations, leg edema, orthopnea, PND, TIA/syncope. On a separate note, patient has had elevated cholesterol for many years. He has tried different statins in the past, but says that none of them worked. He denied any side effects, but is not ready to start either statins or non-statin lipid lowering agents at this time.,    Past Medical History:  Diagnosis Date   Abnormal EKG    Acute tear lateral meniscus    Aortic regurgitation    Aortic stenosis    Atherosclerotic peripheral vascular disease (HCC)    Benign prostatic hyperplasia    BPH (benign prostatic hyperplasia)    Carotid atherosclerosis    Chronic leukopenia    Heart murmur    History of smoking    Hypercholesterolemia    Hyperlipidemia    Hypertension    Mild aortic valve regurgitation    Nephrolithiasis    RBBB      History reviewed. No pertinent surgical history.   Social History   Tobacco Use  Smoking Status Former   Packs/day: 1.00   Years: 10.00   Total pack years: 10.00   Types: Cigarettes   Quit date: 05/22/1970   Years since quitting: 51.4  Smokeless Tobacco Current    Types: Chew    Social History   Substance and Sexual Activity  Alcohol Use No     Family History  Problem Relation Age of Onset   Asthma Mother    Other Father        BRAIN TURMOR   Cancer - Other Father        MALIGNANT NEOPLASM      Current Outpatient Medications:    aspirin 81 MG EC tablet, Take 81 mg by mouth daily. , Disp: , Rfl:    Cardiovascular and other pertinent studies:  Reviewed external labs and tests, independently interpreted  EKG 11/03/2021: Sinus rhythm 77 bpm with frequent PAC's Incomplete right bundle branch block Nonspecific ST-T abnormality  Echocardiogram 10/05/2020: 1. Left ventricular ejection fraction, by estimation, is 55 to 60%. The  left ventricle has normal function. The left ventricle has no regional  wall motion abnormalities. There is mild left ventricular hypertrophy.  Left ventricular diastolic parameters  are consistent with Grade I diastolic dysfunction (impaired relaxation).   2. Right ventricular systolic function is normal. The right ventricular  size is normal.   3. Left atrial size was moderately dilated.   4. The mitral valve is abnormal. Trivial mitral valve regurgitation.    Comparison(s): Prior images unable to be directly viewed, comparison made  by report only. Changes from prior study are noted.  09/24/2018: LVEF 55%,  moderate to severe AS - mean gradient 27 mmHg, moderate AI.    Recent labs: 10/21/2021: Glucose 95, BUN/Cr 17/1.47. EGFR 43. Na/K 139/4.3. Rest of the CMP normal H/H 13/39. MCV 83. Platelets 183 HbA1C NA Chol 235, TG 145, HDL 49, LDL 157 TSH NA    Review of Systems  Cardiovascular:  Negative for chest pain, dyspnea on exertion, leg swelling, palpitations and syncope.  Skin:        Easy skin bruising          Vitals:   11/03/21 0907  BP: (!) 138/52  Pulse: 87  Resp: 16  Temp: 97.8 F (36.6 C)  SpO2: 97%     Body mass index is 21.76 kg/m. Filed Weights   11/03/21 0907   Weight: 156 lb (70.8 kg)     Objective:   Physical Exam Vitals and nursing note reviewed.  Constitutional:      General: He is not in acute distress. Neck:     Vascular: No JVD.  Cardiovascular:     Rate and Rhythm: Normal rate and regular rhythm.     Pulses:          Carotid pulses are  on the right side with bruit and  on the left side with bruit.      Dorsalis pedis pulses are 1+ on the right side and 1+ on the left side.       Posterior tibial pulses are 0 on the right side and 0 on the left side.     Heart sounds: Murmur heard.     Harsh midsystolic murmur is present with a grade of 3/6 at the upper right sternal border radiating to the neck. Also has Gallavardin phenomenon murmur at apex  Pulmonary:     Effort: Pulmonary effort is normal.     Breath sounds: Normal breath sounds. No wheezing or rales.          Visit diagnoses:   ICD-10-CM   1. Nonrheumatic aortic insufficiency with aortic stenosis  I35.2 EKG 12-Lead    PCV STRAIN IMAGING ECHOCARDIOGRAM    B Nat Peptide    2. Hypertension with heart disease  I11.9 PCV STRAIN IMAGING ECHOCARDIOGRAM    3. Bilateral carotid bruits  R09.89 PCV CAROTID DUPLEX (BILATERAL)       Orders Placed This Encounter  Procedures   B Nat Peptide   EKG 12-Lead   PCV STRAIN IMAGING ECHOCARDIOGRAM   PCV CAROTID DUPLEX (BILATERAL)     Assessment & Recommendations:    87 y.o. Caucasian male with hypertension, hyperlipidemia, aortic stenosis, carotid atherosclerosis, stage IIIb kidney disease, irregular heart rhythm  Aortic stenosis: Likely severe but completely asymptomatic.  Check echocardiogram w/strain, BNP. Given carotid bruit, will check carotid duplex.,  Depending on the findings, I will either repeat echocardiogram in 6 months or refer to structural valve clinic.  In absence of sever bleeding, okay to continue Aspirin, maybe every other day. He is reluctant to start statin therapy at this time. Blood pressure  reasonably normal without any anti-hypertensive agents.   Further recommendation after above testing.    Thank you for referring the patient to [place]. Please feel free to contact with any questions.   Nigel Mormon, MD Pager: [phone] Office: [phone]

## 2021-11-04 LAB — BRAIN NATRIURETIC PEPTIDE: BNP: 564.1 pg/mL — ABNORMAL HIGH (ref 0.0–100.0)

## 2021-11-06 ENCOUNTER — Other Ambulatory Visit: Payer: Self-pay | Admitting: Cardiology

## 2021-11-09 ENCOUNTER — Institutional Professional Consult (permissible substitution): Payer: Medicare Other | Admitting: Pulmonary Disease

## 2021-11-14 ENCOUNTER — Ambulatory Visit (HOSPITAL_COMMUNITY): Admission: RE | Admit: 2021-11-14 | Payer: Medicare Other | Source: Ambulatory Visit

## 2021-11-24 ENCOUNTER — Encounter: Payer: Self-pay | Admitting: Pulmonary Disease

## 2021-11-24 ENCOUNTER — Ambulatory Visit: Payer: Medicare Other | Admitting: Pulmonary Disease

## 2021-11-24 VITALS — BP 128/72 | HR 66 | Ht 71.0 in | Wt 154.0 lb

## 2021-11-24 DIAGNOSIS — J849 Interstitial pulmonary disease, unspecified: Secondary | ICD-10-CM

## 2021-11-24 NOTE — Progress Notes (Signed)
Synopsis: Referred in August 2023 for concern for interstitial lung disease.  Has severe aortic stenosis.  Smoked 1 ppd for 30 years. CXR showed interstitial changes in bases and periphery  Subjective:   PATIENT ID: Dwayne [name] GENDER: male DOB: [birth year], MRN: [id number]   HPI  Chief Complaint  Patient presents with   Consult    Referred by PCP for abnormal lung sounds discovered during his last physical. Denies any pulmonary concerns.     Dwayne [name] was sent to [place] because he has some crackles on his lung exam.   Denies dyspnea No cough Used to smoke cigarettes, about 1ppd, quit 35 years ago, started age 35.    He says that as a child had some mild asthma.  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  He says that the environment was clean.  His hobbies have included hunting and fishing, doesn't do much woodworking anymore but used to do it.    A few years ago he had to mitigate some mold in a bathroom.   No pneumonia.  No trouble swallowing.   Records from his visit with cardiology from July 2023 reviewed where he was seen for severe aortic stenosis, due to lack of symptoms and advanced age no further changes in management were made.  Past Medical History:  Diagnosis Date   Abnormal EKG    Acute tear lateral meniscus    Aortic regurgitation    Aortic stenosis    Atherosclerotic peripheral vascular disease (HCC)    Benign prostatic hyperplasia    BPH (benign prostatic hyperplasia)    Carotid atherosclerosis    Chronic leukopenia    Heart murmur    History of smoking    Hypercholesterolemia    Hyperlipidemia    Hypertension    Mild aortic valve regurgitation    Nephrolithiasis    RBBB      Family History  Problem Relation Age of Onset   Asthma Mother    Other Father        BRAIN TURMOR   Cancer - Other Father        MALIGNANT NEOPLASM     Social History   Socioeconomic History    Marital status: Widowed    Spouse name: Not on file   Number of children: 2   Years of education: Not on file   Highest education level: Not on file  Occupational History   Not on file  Tobacco Use   Smoking status: Former    Packs/day: 1.00    Years: 10.00    Total pack years: 10.00    Types: Cigarettes    Quit date: 05/22/1970    Years since quitting: 51.5   Smokeless tobacco: Current    Types: Chew  Vaping Use   Vaping Use: Never used  Substance and Sexual Activity   Alcohol use: No   Drug use: No   Sexual activity: Not on file  Other Topics Concern   Not on file  Social History Narrative   Not on file   Social Determinants of Health   Financial Resource Strain: Not on file  Food Insecurity: Not on file  Transportation Needs: Not on file  Physical Activity: Not on file  Stress: Not on file  Social Connections: Not on file  Intimate Partner Violence: Not on file     Allergies  Allergen Reactions   Crestor [Rosuvastatin Calcium]     myalgia  Lipitor [Atorvastatin Calcium]     myalgia   Trazodone And Nefazodone     Constipation      Outpatient Medications Prior to Visit  Medication Sig Dispense Refill   aspirin 81 MG EC tablet Take 81 mg by mouth daily.      No facility-administered medications prior to visit.    Review of Systems  Constitutional:  Negative for chills, fever, malaise/fatigue and weight loss.  HENT:  Negative for congestion, nosebleeds, sinus pain and sore throat.   Eyes:  Negative for photophobia, pain and discharge.  Respiratory:  Positive for cough and shortness of breath. Negative for hemoptysis, sputum production and wheezing.   Cardiovascular:  Negative for chest pain, palpitations, orthopnea and leg swelling.  Gastrointestinal:  Negative for abdominal pain, constipation, diarrhea, nausea and vomiting.  Genitourinary:  Negative for dysuria, frequency, hematuria and urgency.  Musculoskeletal:  Negative for back pain, joint pain,  myalgias and neck pain.  Skin:  Negative for itching and rash.  Neurological:  Negative for tingling, tremors, sensory change, speech change, focal weakness, seizures, weakness and headaches.  Psychiatric/Behavioral:  Negative for memory loss, substance abuse and suicidal ideas. The patient is not nervous/anxious.       Objective:  Physical Exam   Vitals:   11/24/21 1029  BP: 128/72  Pulse: 66  SpO2: 99%  Weight: 154 lb (69.9 kg)  Height: '5\' 11"'$  (1.803 m)   RA  Gen: well appearing, no acute distress HENT: NCAT, OP clear, neck supple without masses Eyes: PERRL, EOMi Lymph: no cervical lymphadenopathy PULM: Coarse crackles mid inspiration in bases CV: RRR, no mgr, no JVD GI: BS+, soft, nontender, no hsm Derm: no rash or skin breakdown MSK: normal bulk and tone Neuro: A&Ox4, CN II-XII intact, strength 5/5 in all 4 extremities Psyche: normal mood and affect   CBC    Component Value Date/Time   HGB 13.3 05/22/2016 1422   HCT 39.0 05/22/2016 1422     Chest imaging: October 23, 2021 chest x-ray images independently reviewed showing interstitial change and likely honeycombing in the periphery and bases of both lungs  PFT:  Labs:  Path:  Echo: July 2022 echocardiogram LVEF 55 to 60%, mild LVH, grade 1 diastolic dysfunction, RV systolic size and function normal, left atrium moderately dilated, aortic valve moderate to severe stenosis with valve gradient of 35 mmHg  Heart Catheterization:       Assessment & Plan:   ILD (interstitial lung disease) ([place])  Discussion: Dwayne [name] presents today with no symptoms but an abnormal lung and CXR exam worrisome for interstitial lung disease.  Over the years he has had a number of environmental exposures but no one occupational history which stands out has a common causes of interstitial lung disease.  He also does not seem to have evidence of an underlying connective tissue disease which could be associated with this.  I explained  to Kahiau and his son today that the differential diagnosis for interstitial lung disease is quite broad and at this point we need to collect more information to try to narrow in on a particular diagnosis.  We spoke generally about the fact that some forms can be quite mild while others can be more progressive and worrisome.  Plan: Interstitial lung disease As we discussed today in clinic there are many different forms of this.  Some are mild and related to old infections, others are progressive and can lead to more serious problems.  At this time I do not have  enough information to be able to tell you exactly what kind you have. We will obtain a high-resolution CT scan of the chest We will obtain a full pulmonary function test After these tests have been performed we will see you back in clinic and go over the findings  Follow up in 2-4 weeks, 30 minute visit with Ellen Mayol    Current Outpatient Medications:    aspirin 81 MG EC tablet, Take 81 mg by mouth daily. , Disp: , Rfl:

## 2021-11-24 NOTE — Patient Instructions (Signed)
Interstitial lung disease As we discussed today in clinic there are many different forms of this.  Some are mild and related to old infections, others are progressive and can lead to more serious problems.  At this time I do not have enough information to be able to tell you exactly what kind you have. We will obtain a high-resolution CT scan of the chest We will obtain a full pulmonary function test After these tests have been performed we will see you back in clinic and go over the findings  Follow up in 2-4 weeks, 30 minute visit with [facility]

## 2021-12-07 ENCOUNTER — Other Ambulatory Visit: Payer: Medicare Other

## 2021-12-07 ENCOUNTER — Ambulatory Visit: Payer: Medicare Other

## 2021-12-07 DIAGNOSIS — R0989 Other specified symptoms and signs involving the circulatory and respiratory systems: Secondary | ICD-10-CM

## 2022-01-09 ENCOUNTER — Other Ambulatory Visit: Payer: Self-pay

## 2022-01-09 DIAGNOSIS — J849 Interstitial pulmonary disease, unspecified: Secondary | ICD-10-CM

## 2022-01-09 NOTE — Progress Notes (Unsigned)
Synopsis: Referred in August 2023 for concern for interstitial lung disease.  Has severe aortic stenosis.  Smoked 1 ppd for 30 years. CXR showed interstitial changes in bases and periphery Used to smoke cigarettes, about 1ppd for 32 years, quit around age 87, started age 64.   He has severe aortic stenosis   Subjective:   PATIENT ID: Dwayne [name] GENDER: male DOB: 29-Jan-1935, MRN: [id number]   HPI  No chief complaint on file.   ***  Past Medical History:  Diagnosis Date   Abnormal EKG    Acute tear lateral meniscus    Aortic regurgitation    Aortic stenosis    Atherosclerotic peripheral vascular disease (HCC)    Benign prostatic hyperplasia    BPH (benign prostatic hyperplasia)    Carotid atherosclerosis    Chronic leukopenia    Heart murmur    History of smoking    Hypercholesterolemia    Hyperlipidemia    Hypertension    Mild aortic valve regurgitation    Nephrolithiasis    RBBB      Family History  Problem Relation Age of Onset   Asthma Mother    Other Father        BRAIN TURMOR   Cancer - Other Father        MALIGNANT NEOPLASM     Social History   Socioeconomic History   Marital status: Widowed    Spouse name: Not on file   Number of children: 2   Years of education: Not on file   Highest education level: Not on file  Occupational History   Not on file  Tobacco Use   Smoking status: Former    Packs/day: 1.00    Years: 10.00    Total pack years: 10.00    Types: Cigarettes    Quit date: 05/22/1970    Years since quitting: 51.6   Smokeless tobacco: Current    Types: Chew  Vaping Use   Vaping Use: Never used  Substance and Sexual Activity   Alcohol use: No   Drug use: No   Sexual activity: Not on file  Other Topics Concern   Not on file  Social History Narrative   Not on file   Social Determinants of Health   Financial Resource Strain: Not on file  Food Insecurity: Not on file  Transportation Needs: Not on file  Physical Activity:  Not on file  Stress: Not on file  Social Connections: Not on file  Intimate Partner Violence: Not on file     Allergies  Allergen Reactions   Crestor [Rosuvastatin Calcium]     myalgia   Lipitor [Atorvastatin Calcium]     myalgia   Trazodone And Nefazodone     Constipation      Outpatient Medications Prior to Visit  Medication Sig Dispense Refill   aspirin 81 MG EC tablet Take 81 mg by mouth daily.      No facility-administered medications prior to visit.    Review of Systems  Constitutional:  Negative for chills, fever, malaise/fatigue and weight loss.  HENT:  Negative for congestion, nosebleeds, sinus pain and sore throat.   Eyes:  Negative for photophobia, pain and discharge.  Respiratory:  Positive for cough and shortness of breath. Negative for hemoptysis, sputum production and wheezing.   Cardiovascular:  Negative for chest pain, palpitations, orthopnea and leg swelling.  Gastrointestinal:  Negative for abdominal pain, constipation, diarrhea, nausea and vomiting.  Genitourinary:  Negative for dysuria, frequency, hematuria and urgency.  Musculoskeletal:  Negative for back pain, joint pain, myalgias and neck pain.  Skin:  Negative for itching and rash.  Neurological:  Negative for tingling, tremors, sensory change, speech change, focal weakness, seizures, weakness and headaches.  Psychiatric/Behavioral:  Negative for memory loss, substance abuse and suicidal ideas. The patient is not nervous/anxious.       Objective:  Physical Exam   There were no vitals filed for this visit.  RA  Gen: well appearing, no acute distress HENT: NCAT, OP clear, neck supple without masses Eyes: PERRL, EOMi Lymph: no cervical lymphadenopathy PULM: Coarse crackles mid inspiration in bases CV: RRR, no mgr, no JVD GI: BS+, soft, nontender, no hsm Derm: no rash or skin breakdown MSK: normal bulk and tone Neuro: A&Ox4, CN II-XII intact, strength 5/5 in all 4 extremities Psyche:  normal mood and affect   CBC    Component Value Date/Time   HGB 13.3 05/22/2016 1422   HCT 39.0 05/22/2016 1422     Chest imaging: October 23, 2021 chest x-ray images independently reviewed showing interstitial change and likely honeycombing in the periphery and bases of both lungs  PFT:  Labs:  Path:  Echo: July 2022 echocardiogram LVEF 55 to 60%, mild LVH, grade 1 diastolic dysfunction, RV systolic size and function normal, left atrium moderately dilated, aortic valve moderate to severe stenosis with valve gradient of 35 mmHg  Heart Catheterization:       Assessment & Plan:   No diagnosis found.  Discussion: Dwayne [name] presents today with no symptoms but an abnormal lung and CXR exam worrisome for interstitial lung disease.  Over the years he has had a number of environmental exposures but no one occupational history which stands out has a common causes of interstitial lung disease.  He also does not seem to have evidence of an underlying connective tissue disease which could be associated with this.  I explained to Dwayne [name] and his son today that the differential diagnosis for interstitial lung disease is quite broad and at this point we need to collect more information to try to narrow in on a particular diagnosis.  We spoke generally about the fact that some forms can be quite mild while others can be more progressive and worrisome.  Plan: Interstitial lung disease As we discussed today in clinic there are many different forms of this.  Some are mild and related to old infections, others are progressive and can lead to more serious problems.  At this time I do not have enough information to be able to tell you exactly what kind you have. We will obtain a high-resolution CT scan of the chest We will obtain a full pulmonary function test After these tests have been performed we will see you back in clinic and go over the findings  Follow up in 2-4 weeks, 30 minute visit with  Kristjan Derner    Current Outpatient Medications:    aspirin 81 MG EC tablet, Take 81 mg by mouth daily. , Disp: , Rfl:

## 2022-01-10 ENCOUNTER — Ambulatory Visit: Payer: Medicare Other | Admitting: Pulmonary Disease

## 2022-01-10 ENCOUNTER — Encounter: Payer: Self-pay | Admitting: Pulmonary Disease

## 2022-01-10 ENCOUNTER — Ambulatory Visit (INDEPENDENT_AMBULATORY_CARE_PROVIDER_SITE_OTHER): Payer: Medicare Other | Admitting: Pulmonary Disease

## 2022-01-10 VITALS — BP 136/82 | HR 55 | Ht 70.0 in | Wt 152.4 lb

## 2022-01-10 DIAGNOSIS — J849 Interstitial pulmonary disease, unspecified: Secondary | ICD-10-CM

## 2022-01-10 LAB — PULMONARY FUNCTION TEST
DL/VA % pred: 60 %
DL/VA: 2.32 ml/min/mmHg/L
DLCO cor % pred: 50 %
DLCO cor: 12.12 ml/min/mmHg
DLCO unc % pred: 50 %
DLCO unc: 12.12 ml/min/mmHg
FEF 25-75 Post: 0.67 L/sec
FEF 25-75 Pre: 1.84 L/sec
FEF2575-%Change-Post: -63 %
FEF2575-%Pred-Post: 37 %
FEF2575-%Pred-Pre: 103 %
FEV1-%Change-Post: -29 %
FEV1-%Pred-Post: 68 %
FEV1-%Pred-Pre: 98 %
FEV1-Post: 1.86 L
FEV1-Pre: 2.65 L
FEV1FVC-%Change-Post: -24 %
FEV1FVC-%Pred-Pre: 103 %
FEV6-%Change-Post: -9 %
FEV6-%Pred-Post: 90 %
FEV6-%Pred-Pre: 100 %
FEV6-Post: 3.24 L
FEV6-Pre: 3.59 L
FEV6FVC-%Change-Post: -3 %
FEV6FVC-%Pred-Post: 103 %
FEV6FVC-%Pred-Pre: 107 %
FVC-%Change-Post: -6 %
FVC-%Pred-Post: 87 %
FVC-%Pred-Pre: 93 %
FVC-Post: 3.36 L
FVC-Pre: 3.61 L
Post FEV1/FVC ratio: 55 %
Post FEV6/FVC ratio: 96 %
Pre FEV1/FVC ratio: 73 %
Pre FEV6/FVC Ratio: 100 %
RV % pred: 106 %
RV: 2.93 L
TLC % pred: 83 %
TLC: 5.94 L

## 2022-01-10 NOTE — Patient Instructions (Signed)
Diffuse parenchymal lung disease/pulmonary fibrosis I believe you have a condition called idiopathic pulmonary fibrosis or IPF for short We need to get a high-resolution CT scan of your chest to confirm the diagnosis Once we see the results of that I will call you to discuss whether or not you would be willing to start treatment with Esbriet or Ofev as we discussed in the office today  I will plan on seeing you back in 4 weeks or sooner if needed

## 2022-01-10 NOTE — Addendum Note (Signed)
Addended by: Valerie Salts on: 01/10/2022 10:52 AM   Modules accepted: Orders

## 2022-01-10 NOTE — Progress Notes (Signed)
PFT done today. 

## 2022-02-02 ENCOUNTER — Other Ambulatory Visit: Payer: Medicare Other

## 2022-02-06 ENCOUNTER — Ambulatory Visit
Admission: RE | Admit: 2022-02-06 | Discharge: 2022-02-06 | Disposition: A | Discharge status: Home / Self Care | Payer: Medicare Other | Source: Ambulatory Visit | Attending: Pulmonary Disease | Admitting: Pulmonary Disease

## 2022-02-06 DIAGNOSIS — J849 Interstitial pulmonary disease, unspecified: Secondary | ICD-10-CM

## 2022-02-06 DIAGNOSIS — I2510 Atherosclerotic heart disease of native coronary artery without angina pectoris: Secondary | ICD-10-CM | POA: Diagnosis not present

## 2022-02-06 DIAGNOSIS — J479 Bronchiectasis, uncomplicated: Secondary | ICD-10-CM | POA: Diagnosis not present

## 2022-02-06 DIAGNOSIS — J432 Centrilobular emphysema: Secondary | ICD-10-CM | POA: Diagnosis not present

## 2022-02-06 DIAGNOSIS — R918 Other nonspecific abnormal finding of lung field: Secondary | ICD-10-CM | POA: Diagnosis not present

## 2022-02-06 NOTE — Progress Notes (Unsigned)
   Synopsis: Referred in August 2023 for concern for interstitial lung disease.  Has severe aortic stenosis.  Smoked 1 ppd for 30 years. CXR showed interstitial changes in bases and periphery Used to smoke cigarettes, about 1ppd for 32 years, quit around age 87, started age 41.   He has severe aortic stenosis   Subjective:   PATIENT ID: Dwayne [name] GENDER: male DOB: [birth year], MRN: [id number]   HPI  No chief complaint on file.  ***  Past Medical History:  Diagnosis Date   Abnormal EKG    Acute tear lateral meniscus    Aortic regurgitation    Aortic stenosis    Atherosclerotic peripheral vascular disease (HCC)    Benign prostatic hyperplasia    BPH (benign prostatic hyperplasia)    Carotid atherosclerosis    Chronic leukopenia    Heart murmur    History of smoking    Hypercholesterolemia    Hyperlipidemia    Hypertension    Mild aortic valve regurgitation    Nephrolithiasis    RBBB        Review of Systems  Constitutional:  Negative for diaphoresis, fever, malaise/fatigue and weight loss.  HENT:  Negative for congestion, nosebleeds and sinus pain.   Respiratory:  Negative for cough, sputum production, shortness of breath, wheezing and stridor.   Cardiovascular:  Negative for chest pain, orthopnea and leg swelling.  ***    Objective:  Physical Exam   There were no vitals filed for this visit.  RA  ***  CBC    Component Value Date/Time   HGB 13.3 05/22/2016 1422   HCT 39.0 05/22/2016 1422     Chest imaging: October 23, 2021 chest x-ray images independently reviewed showing interstitial change and likely honeycombing in the periphery and bases of both lungs February 06, 2022 CT high-resolution chest images independently reviewed showing some paraseptal emphysema in the upper lobes, some centrilobular emphysema.  However, there is interlobular septal thickening, traction bronchiectasis and honeycombing with worsening findings in the bases of both lungs.   Findings consistent with UIP  PFT: October 2023: Ratio 73%, FVC 3.61 L 93% predicted, total lung capacity 5.94 83% predicted, DLCO 12.12 mL/min/mmHg 50% predicted  Labs:  Path:  Echo: July 2022 echocardiogram LVEF 55 to 60%, mild LVH, grade 1 diastolic dysfunction, RV systolic size and function normal, left atrium moderately dilated, aortic valve moderate to severe stenosis with valve gradient of 35 mmHg  Heart Catheterization:       Assessment & Plan:   No diagnosis found.  Discussion: ***    Current Outpatient Medications:    aspirin 81 MG EC tablet, Take 81 mg by mouth daily. , Disp: , Rfl:   > 50% of this 40 min visit spent face to face

## 2022-02-07 ENCOUNTER — Encounter: Payer: Self-pay | Admitting: Pulmonary Disease

## 2022-02-07 ENCOUNTER — Ambulatory Visit (INDEPENDENT_AMBULATORY_CARE_PROVIDER_SITE_OTHER): Payer: Medicare Other | Admitting: Pulmonary Disease

## 2022-02-07 VITALS — BP 126/74 | HR 68 | Ht 71.0 in | Wt 152.0 lb

## 2022-02-07 DIAGNOSIS — J849 Interstitial pulmonary disease, unspecified: Secondary | ICD-10-CM

## 2022-02-07 DIAGNOSIS — J84112 Idiopathic pulmonary fibrosis: Secondary | ICD-10-CM | POA: Diagnosis not present

## 2022-02-07 LAB — SEDIMENTATION RATE: Sed Rate: 15 mm/hr (ref 0–20)

## 2022-02-07 LAB — C-REACTIVE PROTEIN: CRP: <1 mg/dL (ref 0.5–20.0)

## 2022-02-07 NOTE — Patient Instructions (Signed)
Idiopathic pulmonary fibrosis: We will check blood work today to ensure there is no evidence of an underlying connective tissue disease which is causing this If you decide to start treatment with antifibrotic's then we would be very happy to start that for you I will plan on seeing you back in early January to see if you have any further thoughts about starting treatment Otherwise, we will plan on following with every 31-monthpulmonary function test  I will see you back in January or sooner if needed

## 2022-02-11 LAB — ANCA SCREEN W REFLEX TITER: ANCA SCREEN: NEGATIVE

## 2022-02-11 LAB — SJOGREN'S SYNDROME ANTIBODS(SSA + SSB)
SSA (Ro) (ENA) Antibody, IgG: <1 AI
SSB (La) (ENA) Antibody, IgG: <1 AI

## 2022-02-11 LAB — CENTROMERE ANTIBODIES: Centromere Ab Screen: <1 AI

## 2022-02-11 LAB — RHEUMATOID FACTOR: Rheumatoid fact SerPl-aCnc: <14 IU/mL (ref <14)

## 2022-02-11 LAB — ANA: Anti Nuclear Antibody (ANA): NEGATIVE

## 2022-02-11 LAB — ANTI-DNA ANTIBODY, DOUBLE-STRANDED: ds DNA Ab: <1 IU/mL

## 2022-02-11 LAB — ANTI-SCLERODERMA ANTIBODY: Scleroderma (Scl-70) (ENA) Antibody, IgG: <1 AI

## 2022-02-11 LAB — ANTI-SMITH ANTIBODY: ENA SM Ab Ser-aCnc: <1 AI

## 2022-02-11 LAB — CYCLIC CITRUL PEPTIDE ANTIBODY, IGG: Cyclic Citrullin Peptide Ab: <16 UNITS

## 2022-02-12 LAB — CK TOTAL AND CKMB (NOT AT ARMC)
CK, MB: <0.7 ng/mL (ref 0–5.0)
Total CK: 33 U/L — ABNORMAL LOW (ref 44–196)

## 2022-02-12 LAB — ALDOLASE: Aldolase: 3.1 U/L (ref <=8.1)

## 2022-02-13 LAB — HYPERSENSITIVITY PNEUMONITIS
A. Pullulans Abs: NEGATIVE
A.Fumigatus #1 Abs: NEGATIVE
Micropolyspora faeni, IgG: NEGATIVE
Pigeon Serum Abs: NEGATIVE
Thermoact. Saccharii: NEGATIVE
Thermoactinomyces vulgaris, IgG: NEGATIVE

## 2022-02-13 LAB — ANTI-JO 1 ANTIBODY, IGG: Anti JO-1: <0.2 AI (ref 0.0–0.9)

## 2022-02-13 LAB — RNP ANTIBODIES: ENA RNP Ab: 0.2 AI (ref 0.0–0.9)

## 2022-04-12 DIAGNOSIS — J069 Acute upper respiratory infection, unspecified: Secondary | ICD-10-CM | POA: Diagnosis not present

## 2022-04-17 ENCOUNTER — Encounter: Payer: Self-pay | Admitting: Pulmonary Disease

## 2022-04-17 ENCOUNTER — Ambulatory Visit: Payer: Medicare Other | Admitting: Pulmonary Disease

## 2022-04-17 ENCOUNTER — Ambulatory Visit (INDEPENDENT_AMBULATORY_CARE_PROVIDER_SITE_OTHER): Payer: Medicare Other

## 2022-04-17 VITALS — BP 124/66 | HR 98 | Ht 71.0 in | Wt 157.2 lb

## 2022-04-17 DIAGNOSIS — R051 Acute cough: Secondary | ICD-10-CM

## 2022-04-17 DIAGNOSIS — R059 Cough, unspecified: Secondary | ICD-10-CM | POA: Diagnosis not present

## 2022-04-17 DIAGNOSIS — R0602 Shortness of breath: Secondary | ICD-10-CM | POA: Diagnosis not present

## 2022-04-17 MED ORDER — DOXYCYCLINE HYCLATE 100 MG PO TABS: 100.0000 mg | ORAL_TABLET | Freq: Two times a day (BID) | ORAL | 0 refills | Status: DC

## 2022-04-17 MED ORDER — PREDNISONE 20 MG PO TABS: ORAL_TABLET | ORAL | 0 refills | Status: DC

## 2022-04-17 NOTE — Progress Notes (Signed)
$'@Patient'q$  ID: Dwayne [name], male    DOB: 30-Aug-1934, 88 y.o.   MRN: [id number]  Chief Complaint  Patient presents with   Acute Visit    Cough lasting a week and a half. No fever noted. Coughing making him have SOB. SOB seems to be getting worse. Went to PCP last week but was not given any medication. Cough is some what productive.     Referring provider: Deland Pretty, MD  HPI:   88 y.o. man with CT scan findings of emphysema and UIP likely IPF presents with 1 to 2 weeks of worsening cough and shortness of breath.  Most recent PCP note reviewed.  Most recent pulmonary note x 3 reviewed.  Patient of Dr. Lake Bells.  Ongoing workup for shortness of breath revealed CT findings of ILD and emphysema.  Ongoing discussion about antifibrotic's.  Has had about 10 days of cough.  Productive of sputum.  This is new.  Cough not much of an issue prior.  Associated with worsening shortness of breath.  Dyspnea on exertion.  Worse on inclines or stairs.  No recent surgeries.  No recent travel.  No prolonged incapacitation, lack of mobility.  Recently went to PCP.  Was diagnosed with URI.  No inventions made.  This is last week.  They advised him to follow-up with pulmonary.  This prompted presentation today.  Lack of improvement.  CT high-resolution on my review and interpretation reveals a blood predominant emphysema, peripheral basilar lower lobe dominant interstitial changes with mild honeycombing.   Questionaires / Pulmonary Flowsheets:   ACT:      No data to display          MMRC:     No data to display          Epworth:      No data to display          Tests:   FENO:  No results found for: "NITRICOXIDE"  PFT:    Latest Ref Rng & Units 01/10/2022    8:34 AM  PFT Results  FVC-Pre L 3.61   FVC-Predicted Pre % 93   FVC-Post L 3.36   FVC-Predicted Post % 87   Pre FEV1/FVC % % 73   Post FEV1/FCV % % 55   FEV1-Pre L 2.65   FEV1-Predicted Pre % 98   FEV1-Post L 1.86    DLCO uncorrected ml/min/mmHg 12.12   DLCO UNC% % 50   DLCO corrected ml/min/mmHg 12.12   DLCO COR %Predicted % 50   DLVA Predicted % 60   TLC L 5.94   TLC % Predicted % 83   RV % Predicted % 106   Personally reviewed and interpreted as normal spirometry, no bronchodilator response, lung volumes within normal limits, DLCO severely reduced  WALK:      No data to display          Imaging: No results found.  Lab Results:  CBC    Component Value Date/Time   HGB 13.3 05/22/2016 1422   HCT 39.0 05/22/2016 1422    BMET    Component Value Date/Time   NA 138 05/22/2016 1422   K 4.1 05/22/2016 1422   CL 101 05/22/2016 1422   GLUCOSE 101 (H) 05/22/2016 1422   BUN 25 (H) 05/22/2016 1422   CREATININE 1.30 (H) 05/22/2016 1422    BNP    Component Value Date/Time   BNP 564.1 (H) 11/03/2021 1029    ProBNP No results found for: "PROBNP"  Specialty  Problems   None   Allergies  Allergen Reactions   Crestor [Rosuvastatin Calcium]     myalgia   Lipitor [Atorvastatin Calcium]     myalgia   Trazodone And Nefazodone     Constipation      There is no immunization history on file for this patient.  Past Medical History:  Diagnosis Date   Abnormal EKG    Acute tear lateral meniscus    Aortic regurgitation    Aortic stenosis    Atherosclerotic peripheral vascular disease (HCC)    Benign prostatic hyperplasia    BPH (benign prostatic hyperplasia)    Carotid atherosclerosis    Chronic leukopenia    Heart murmur    History of smoking    Hypercholesterolemia    Hyperlipidemia    Hypertension    Mild aortic valve regurgitation    Nephrolithiasis    RBBB     Tobacco History: Social History   Tobacco Use  Smoking Status Former   Packs/day: 1.00   Years: 10.00   Total pack years: 10.00   Types: Cigarettes   Quit date: 05/22/1970   Years since quitting: 51.9  Smokeless Tobacco Current   Types: Chew   Ready to quit: Not Answered Counseling given: Not  Answered   Continue to not smoke  Outpatient Encounter Medications as of 04/17/2022  Medication Sig   aspirin 81 MG EC tablet Take 81 mg by mouth daily.    doxycycline (VIBRA-TABS) 100 MG tablet Take 1 tablet (100 mg total) by mouth 2 (two) times daily.   predniSONE (DELTASONE) 20 MG tablet Take 2 tablets (40 mg total) by mouth daily with breakfast for 5 days, THEN 1 tablet (20 mg total) daily with breakfast for 5 days.   No facility-administered encounter medications on file as of 04/17/2022.     Review of Systems  Review of Systems  No chest pain with exertion.  No orthopnea or PND.  Comprehensive review of systems otherwise negative. Physical Exam  BP 124/66 (BP Location: Left Arm, Patient Position: Sitting, Cuff Size: Normal)   Pulse 98   Ht '5\' 11"'$  (1.803 m)   Wt 157 lb 3.2 oz (71.3 kg)   SpO2 91%   BMI 21.92 kg/m   Wt Readings from Last 5 Encounters:  04/17/22 157 lb 3.2 oz (71.3 kg)  02/07/22 152 lb (68.9 kg)  01/10/22 152 lb 6.4 oz (69.1 kg)  11/24/21 154 lb (69.9 kg)  11/03/21 156 lb (70.8 kg)    BMI Readings from Last 5 Encounters:  04/17/22 21.92 kg/m  02/07/22 21.20 kg/m  01/10/22 21.87 kg/m  11/24/21 21.48 kg/m  11/03/21 21.76 kg/m     Physical Exam General: Sitting in chair, in no acute distress, elderly Eyes: EOMI, no icterus Neck: Supple, JVP Pulmonary: Diffuse inspiratory crackles in the bases, clears cephalad, no other abnormal sounds Cardiovascular warm, no edema Abdomen: Nondistended, sounds present MSK: No synovitis, no joint fusion Neuro: Normal gait, no weakness Psych: Normal mood, full affect   Assessment & Plan:   Acute to subacute cough and worsening dyspnea on exertion: Suspicion for exacerbation of underlying emphysema versus ILD.  Symptoms for about a week and a half, 2 weeks.  Crackles on exam but no concern for pneumonia.  Chest x-ray for further evaluation.  Prednisone taper, antibiotic therapy, doxycycline, for presumed COPD  exacerbation.  ILD: Likely UIP based on imaging.  Ongoing discussions about therapy with Dr. Lake Bells.  Emphysema: On imaging.  PFTs without COPD but does demonstrate  air trapping.  Consider LAMA/LABA therapy in the future if remains short of breath.   Return in about 2 weeks (around 05/01/2022).   Lanier Clam, MD 04/17/2022   This appointment required 61 minutes of patient care (this includes precharting, chart review, review of results, face-to-face care, etc.).

## 2022-04-17 NOTE — Patient Instructions (Signed)
Nice to meet you  With the shortness of breath cough, congestion the last several days I think it is important to treat you with steroids and an antibiotic.  Take prednisone 40 mg for 5 days then 20 mg for 5 days then stop  Take doxycycline 1 tablet twice a day for 7 days  Will get a chest x-ray today to make sure there is nothing we need to worry about there, based on your lung exam I expect will be clear but it would be nice to be reassured that there is no pneumonia would need to worry about  Return to clinic in 2 to 4 weeks with Dr. Lake Bells

## 2022-04-22 ENCOUNTER — Emergency Department (HOSPITAL_BASED_OUTPATIENT_CLINIC_OR_DEPARTMENT_OTHER): Payer: Medicare Other | Admitting: Radiology

## 2022-04-22 ENCOUNTER — Emergency Department (HOSPITAL_BASED_OUTPATIENT_CLINIC_OR_DEPARTMENT_OTHER): Payer: Medicare Other

## 2022-04-22 ENCOUNTER — Encounter (HOSPITAL_BASED_OUTPATIENT_CLINIC_OR_DEPARTMENT_OTHER): Payer: Self-pay | Admitting: Emergency Medicine

## 2022-04-22 ENCOUNTER — Encounter (HOSPITAL_COMMUNITY): Payer: Self-pay

## 2022-04-22 ENCOUNTER — Inpatient Hospital Stay (HOSPITAL_BASED_OUTPATIENT_CLINIC_OR_DEPARTMENT_OTHER)
Admission: EM | Admit: 2022-04-22 | Discharge: 2022-04-26 | DRG: 441 | Disposition: A | Discharge status: Hospice | Payer: Medicare Other | Attending: Internal Medicine | Admitting: Internal Medicine

## 2022-04-22 ENCOUNTER — Other Ambulatory Visit: Payer: Self-pay

## 2022-04-22 DIAGNOSIS — J439 Emphysema, unspecified: Secondary | ICD-10-CM | POA: Diagnosis present

## 2022-04-22 DIAGNOSIS — G9340 Encephalopathy, unspecified: Secondary | ICD-10-CM | POA: Diagnosis not present

## 2022-04-22 DIAGNOSIS — K7682 Hepatic encephalopathy: Secondary | ICD-10-CM | POA: Diagnosis not present

## 2022-04-22 DIAGNOSIS — N400 Enlarged prostate without lower urinary tract symptoms: Secondary | ICD-10-CM | POA: Diagnosis present

## 2022-04-22 DIAGNOSIS — E869 Volume depletion, unspecified: Secondary | ICD-10-CM | POA: Diagnosis not present

## 2022-04-22 DIAGNOSIS — I5020 Unspecified systolic (congestive) heart failure: Secondary | ICD-10-CM | POA: Diagnosis not present

## 2022-04-22 DIAGNOSIS — R059 Cough, unspecified: Secondary | ICD-10-CM | POA: Diagnosis not present

## 2022-04-22 DIAGNOSIS — I352 Nonrheumatic aortic (valve) stenosis with insufficiency: Secondary | ICD-10-CM | POA: Diagnosis present

## 2022-04-22 DIAGNOSIS — R945 Abnormal results of liver function studies: Secondary | ICD-10-CM | POA: Diagnosis not present

## 2022-04-22 DIAGNOSIS — R5383 Other fatigue: Secondary | ICD-10-CM | POA: Diagnosis not present

## 2022-04-22 DIAGNOSIS — I6523 Occlusion and stenosis of bilateral carotid arteries: Secondary | ICD-10-CM | POA: Diagnosis not present

## 2022-04-22 DIAGNOSIS — J8410 Pulmonary fibrosis, unspecified: Secondary | ICD-10-CM | POA: Diagnosis present

## 2022-04-22 DIAGNOSIS — I4510 Unspecified right bundle-branch block: Secondary | ICD-10-CM | POA: Diagnosis present

## 2022-04-22 DIAGNOSIS — I350 Nonrheumatic aortic (valve) stenosis: Secondary | ICD-10-CM

## 2022-04-22 DIAGNOSIS — K7460 Unspecified cirrhosis of liver: Secondary | ICD-10-CM | POA: Diagnosis present

## 2022-04-22 DIAGNOSIS — Z66 Do not resuscitate: Secondary | ICD-10-CM | POA: Diagnosis not present

## 2022-04-22 DIAGNOSIS — N281 Cyst of kidney, acquired: Secondary | ICD-10-CM | POA: Diagnosis not present

## 2022-04-22 DIAGNOSIS — Z79899 Other long term (current) drug therapy: Secondary | ICD-10-CM

## 2022-04-22 DIAGNOSIS — E876 Hypokalemia: Secondary | ICD-10-CM | POA: Diagnosis not present

## 2022-04-22 DIAGNOSIS — J90 Pleural effusion, not elsewhere classified: Secondary | ICD-10-CM | POA: Diagnosis not present

## 2022-04-22 DIAGNOSIS — E7800 Pure hypercholesterolemia, unspecified: Secondary | ICD-10-CM | POA: Diagnosis present

## 2022-04-22 DIAGNOSIS — E875 Hyperkalemia: Secondary | ICD-10-CM | POA: Diagnosis not present

## 2022-04-22 DIAGNOSIS — Z885 Allergy status to narcotic agent status: Secondary | ICD-10-CM

## 2022-04-22 DIAGNOSIS — E871 Hypo-osmolality and hyponatremia: Secondary | ICD-10-CM | POA: Diagnosis not present

## 2022-04-22 DIAGNOSIS — R54 Age-related physical debility: Secondary | ICD-10-CM | POA: Diagnosis not present

## 2022-04-22 DIAGNOSIS — R9082 White matter disease, unspecified: Secondary | ICD-10-CM | POA: Diagnosis present

## 2022-04-22 DIAGNOSIS — E8770 Fluid overload, unspecified: Secondary | ICD-10-CM | POA: Diagnosis present

## 2022-04-22 DIAGNOSIS — Z7982 Long term (current) use of aspirin: Secondary | ICD-10-CM

## 2022-04-22 DIAGNOSIS — I4891 Unspecified atrial fibrillation: Secondary | ICD-10-CM | POA: Diagnosis present

## 2022-04-22 DIAGNOSIS — R0602 Shortness of breath: Secondary | ICD-10-CM | POA: Diagnosis not present

## 2022-04-22 DIAGNOSIS — I110 Hypertensive heart disease with heart failure: Secondary | ICD-10-CM | POA: Diagnosis not present

## 2022-04-22 DIAGNOSIS — R935 Abnormal findings on diagnostic imaging of other abdominal regions, including retroperitoneum: Secondary | ICD-10-CM | POA: Diagnosis not present

## 2022-04-22 DIAGNOSIS — I509 Heart failure, unspecified: Secondary | ICD-10-CM | POA: Diagnosis not present

## 2022-04-22 DIAGNOSIS — K7031 Alcoholic cirrhosis of liver with ascites: Secondary | ICD-10-CM | POA: Diagnosis not present

## 2022-04-22 DIAGNOSIS — Z1152 Encounter for screening for COVID-19: Secondary | ICD-10-CM

## 2022-04-22 DIAGNOSIS — N179 Acute kidney failure, unspecified: Secondary | ICD-10-CM | POA: Diagnosis not present

## 2022-04-22 DIAGNOSIS — I6381 Other cerebral infarction due to occlusion or stenosis of small artery: Secondary | ICD-10-CM | POA: Diagnosis not present

## 2022-04-22 DIAGNOSIS — I639 Cerebral infarction, unspecified: Secondary | ICD-10-CM | POA: Diagnosis not present

## 2022-04-22 DIAGNOSIS — I5021 Acute systolic (congestive) heart failure: Secondary | ICD-10-CM | POA: Diagnosis not present

## 2022-04-22 DIAGNOSIS — R7989 Other specified abnormal findings of blood chemistry: Secondary | ICD-10-CM | POA: Diagnosis not present

## 2022-04-22 DIAGNOSIS — E8779 Other fluid overload: Secondary | ICD-10-CM

## 2022-04-22 DIAGNOSIS — Z888 Allergy status to other drugs, medicaments and biological substances status: Secondary | ICD-10-CM

## 2022-04-22 DIAGNOSIS — R109 Unspecified abdominal pain: Secondary | ICD-10-CM | POA: Diagnosis not present

## 2022-04-22 DIAGNOSIS — I119 Hypertensive heart disease without heart failure: Secondary | ICD-10-CM | POA: Diagnosis not present

## 2022-04-22 DIAGNOSIS — D509 Iron deficiency anemia, unspecified: Secondary | ICD-10-CM | POA: Diagnosis present

## 2022-04-22 DIAGNOSIS — F17220 Nicotine dependence, chewing tobacco, uncomplicated: Secondary | ICD-10-CM | POA: Diagnosis present

## 2022-04-22 DIAGNOSIS — Z825 Family history of asthma and other chronic lower respiratory diseases: Secondary | ICD-10-CM

## 2022-04-22 DIAGNOSIS — I2510 Atherosclerotic heart disease of native coronary artery without angina pectoris: Secondary | ICD-10-CM | POA: Diagnosis not present

## 2022-04-22 DIAGNOSIS — R188 Other ascites: Secondary | ICD-10-CM | POA: Diagnosis not present

## 2022-04-22 LAB — CBC WITH DIFFERENTIAL/PLATELET
Abs Immature Granulocytes: 0.02 10*3/uL (ref 0.00–0.07)
Basophils Absolute: 0 10*3/uL (ref 0.0–0.1)
Basophils Relative: 0 %
Eosinophils Absolute: 0 10*3/uL (ref 0.0–0.5)
Eosinophils Relative: 0 %
HCT: 37.5 % — ABNORMAL LOW (ref 39.0–52.0)
Hemoglobin: 13 g/dL (ref 13.0–17.0)
Immature Granulocytes: 0 %
Lymphocytes Relative: 3 %
Lymphs Abs: 0.2 10*3/uL — ABNORMAL LOW (ref 0.7–4.0)
MCH: 28.3 pg (ref 26.0–34.0)
MCHC: 34.7 g/dL (ref 30.0–36.0)
MCV: 81.5 fL (ref 80.0–100.0)
Monocytes Absolute: 0.5 10*3/uL (ref 0.1–1.0)
Monocytes Relative: 7 %
Neutro Abs: 6.7 10*3/uL (ref 1.7–7.7)
Neutrophils Relative %: 90 %
Platelets: 129 10*3/uL — ABNORMAL LOW (ref 150–400)
RBC: 4.6 MIL/uL (ref 4.22–5.81)
RDW: 14.3 % (ref 11.5–15.5)
WBC: 7.4 10*3/uL (ref 4.0–10.5)
nRBC: 0.4 % — ABNORMAL HIGH (ref 0.0–0.2)

## 2022-04-22 LAB — URINALYSIS, ROUTINE W REFLEX MICROSCOPIC
Bilirubin Urine: NEGATIVE
Glucose, UA: NEGATIVE mg/dL
Hgb urine dipstick: NEGATIVE
Ketones, ur: NEGATIVE mg/dL
Leukocytes,Ua: NEGATIVE
Nitrite: NEGATIVE
Specific Gravity, Urine: 1.023 (ref 1.005–1.030)
pH: 5 (ref 5.0–8.0)

## 2022-04-22 LAB — COMPREHENSIVE METABOLIC PANEL
ALT: 316 U/L — ABNORMAL HIGH (ref 0–44)
AST: 310 U/L — ABNORMAL HIGH (ref 15–41)
Albumin: 3.6 g/dL (ref 3.5–5.0)
Alkaline Phosphatase: 157 U/L — ABNORMAL HIGH (ref 38–126)
Anion gap: 11 (ref 5–15)
BUN: 76 mg/dL — ABNORMAL HIGH (ref 8–23)
CO2: 21 mmol/L — ABNORMAL LOW (ref 22–32)
Calcium: 9.4 mg/dL (ref 8.9–10.3)
Chloride: 100 mmol/L (ref 98–111)
Creatinine, Ser: 2.25 mg/dL — ABNORMAL HIGH (ref 0.61–1.24)
GFR, Estimated: 28 mL/min — ABNORMAL LOW (ref >60)
Glucose, Bld: 133 mg/dL — ABNORMAL HIGH (ref 70–99)
Potassium: 4.9 mmol/L (ref 3.5–5.1)
Sodium: 132 mmol/L — ABNORMAL LOW (ref 135–145)
Total Bilirubin: 2.4 mg/dL — ABNORMAL HIGH (ref 0.3–1.2)
Total Protein: 6.5 g/dL (ref 6.5–8.1)

## 2022-04-22 LAB — HEPATITIS PANEL, ACUTE
HCV Ab: NONREACTIVE
Hep A IgM: NONREACTIVE
Hep B C IgM: NONREACTIVE
Hepatitis B Surface Ag: NONREACTIVE

## 2022-04-22 LAB — RESP PANEL BY RT-PCR (RSV, FLU A&B, COVID)  RVPGX2
Influenza A by PCR: NEGATIVE
Influenza B by PCR: NEGATIVE
Resp Syncytial Virus by PCR: NEGATIVE
SARS Coronavirus 2 by RT PCR: NEGATIVE

## 2022-04-22 LAB — TROPONIN I (HIGH SENSITIVITY)
Troponin I (High Sensitivity): 275 ng/L (ref <18)
Troponin I (High Sensitivity): 260 ng/L (ref <18)

## 2022-04-22 LAB — CBG MONITORING, ED: Glucose-Capillary: 111 mg/dL — ABNORMAL HIGH (ref 70–99)

## 2022-04-22 LAB — BRAIN NATRIURETIC PEPTIDE: B Natriuretic Peptide: >4500 pg/mL — ABNORMAL HIGH (ref 0.0–100.0)

## 2022-04-22 LAB — LACTIC ACID, PLASMA: Lactic Acid, Venous: 1.7 mmol/L (ref 0.5–1.9)

## 2022-04-22 LAB — AMMONIA: Ammonia: 40 umol/L — ABNORMAL HIGH (ref 9–35)

## 2022-04-22 MED ORDER — ASPIRIN 81 MG PO CHEW
324.0000 mg | CHEWABLE_TABLET | Freq: Once | ORAL | Status: AC
  Administered 2022-04-23: 324 mg via ORAL
  Filled 2022-04-22 (×2): qty 4

## 2022-04-22 MED ORDER — ALBUTEROL SULFATE HFA 108 (90 BASE) MCG/ACT IN AERS
2.0000 | INHALATION_SPRAY | RESPIRATORY_TRACT | Status: DC | PRN
  Administered 2022-04-23: 2 via RESPIRATORY_TRACT
  Filled 2022-04-22: qty 6.7

## 2022-04-22 MED ORDER — LACTATED RINGERS IV BOLUS
1000.0000 mL | Freq: Once | INTRAVENOUS | Status: AC
  Administered 2022-04-22: 1000 mL via INTRAVENOUS

## 2022-04-22 NOTE — ED Notes (Signed)
Pt was seen by pulmonary for this issue on Tuesday, has pulmonary fibrosis. He is being treated empirically for pneumonia with doxy and steroid, but confusion is new.

## 2022-04-22 NOTE — ED Notes (Addendum)
CRITICAL VALUE STICKER  CRITICAL VALUE:  RECEIVER (on-site recipient of call):Abeera Flannery,rn  DATE & TIME NOTIFIED: 04/22/2022 1648  MESSENGER (representative from lab):  MD NOTIFIED: laura murphy, PA  TIME OF [place]  RESPONSE:

## 2022-04-22 NOTE — ED Provider Notes (Signed)
[facility] EMERGENCY DEPT Provider Note   CSN: [id number] Arrival date & time: 04/22/22  1110     History Chief Complaint  Patient presents with   Shortness of Breath    HPI CHRISTIE COPLEY is a 88 y.o. male presenting for chief complaint of altered mental status.  Patient's son is at bedside and provides most of the history as patient is without any acute complaints.  Per the son, he had shortness of breath for 2 weeks, poor p.o. intake over the past 5 days and over the last 48 hours has developed confusion with disorientation. No known sick contacts.  He is otherwise ambulatory tolerating p.o. intake.  Though patient's son states that he is not been eating or drinking very well.  Hx ILD follows with Pulmonology. They have offered antifibrotic therapy and on visit 4 days ago recommended starting LAMA/LABA  for air trapping on PFTs but has not done so yet. Patient's recorded medical, surgical, social, medication list and allergies were reviewed in the Snapshot window as part of the initial history.   Review of Systems   Review of Systems  Unable to perform ROS: Dementia    Physical Exam Updated Vital Signs BP 98/71   Pulse 92   Temp (!) 96.8 F (36 C) (Axillary)   Resp (!) 28   SpO2 92%  Physical Exam Vitals and nursing note reviewed.  Constitutional:      General: He is not in acute distress.    Appearance: He is well-developed.  HENT:     Head: Normocephalic and atraumatic.  Eyes:     Conjunctiva/sclera: Conjunctivae normal.  Cardiovascular:     Rate and Rhythm: Normal rate and regular rhythm.     Heart sounds: No murmur heard. Pulmonary:     Effort: Pulmonary effort is normal. No respiratory distress.     Breath sounds: Normal breath sounds.  Abdominal:     Palpations: Abdomen is soft.     Tenderness: There is no abdominal tenderness.  Musculoskeletal:        General: No swelling.     Cervical back: Neck supple.  Skin:    General: Skin is warm  and dry.     Capillary Refill: Capillary refill takes less than 2 seconds.  Neurological:     Mental Status: He is alert.  Psychiatric:        Mood and Affect: Mood normal.      ED Course/ Medical Decision Making/ A&P Clinical Course as of 04/22/22 2303  Sun Apr 22, 2022  1714 .edus [CC]    Clinical Course User Index [CC] Tretha Sciara, MD    Procedures Ultrasound ED Echo  Date/Time: 04/22/2022 5:14 PM  Performed by: Tretha Sciara, MD Authorized by: Tretha Sciara, MD   Procedure details:    Indications: syncope     Views: subxiphoid, parasternal long axis view, parasternal short axis view, apical 4 chamber view and IVC view     Images: archived   Findings:    Pericardium: no pericardial effusion     LV Function: severly depressed (<30%)     RV Diameter: normal     IVC: normal   Impression:    Impression: normal   .Critical Care  Performed by: Tretha Sciara, MD Authorized by: Tretha Sciara, MD   Critical care provider statement:    Critical care time (minutes):  30   Critical care was time spent personally by me on the following activities:  Development of treatment plan with  patient or surrogate, discussions with consultants, evaluation of patient's response to treatment, examination of patient, ordering and review of laboratory studies, ordering and review of radiographic studies, ordering and performing treatments and interventions, pulse oximetry, re-evaluation of patient's condition and review of old charts    Medications Ordered in ED Medications  albuterol (VENTOLIN HFA) 108 (90 Base) MCG/ACT inhaler 2 puff (has no administration in time range)  aspirin chewable tablet 324 mg (has no administration in time range)  lactated ringers bolus 1,000 mL (0 mLs Intravenous Stopped 04/22/22 1717)    Medical Decision Making:    BURNETT SPRAY is a 88 y.o. male who presented to the ED today with AMS  detailed above.     Additional history discussed  with patient's family/caregivers.  Patient's presentation is complicated by their history of multiple comorbid medical problems.  Patient placed on continuous vitals and telemetry monitoring while in ED which was reviewed periodically.   Complete initial physical exam performed, notably the patient  was HDS in NAD. Grossly confused.      Reviewed and confirmed nursing documentation for past medical history, family history, social history.    Initial Assessment:   This is a critically ill patient.  He has multiple abnormalities appreciated on exam.  He is grossly confused and point-of-care echo demonstrates obvious reduced ejection fraction that is severe.  Initial EKG is nonischemic.  Delta troponins are both elevated though downtrending and BNP is floridly elevated.  Patient is likely had a subacute event that is caused his new heart failure. It appears to be causing multiorgan failure with LFT abnormalities, creatinine rise, BUN elevation.  Broad evaluation was initiated looking for underlying etiology including hepatitis, ACS, PE, intra-abdominal pathology, mesenteric ischemia.  This evaluation is grossly limited by his kidney disease and inability to undergo contrasted scans.  Noncontrasted images do not reveal any acute pathology.  New cirrhosis diagnosis appears likely as well.  Final Assessment and Plan:   Ultimately, multifocal abnormalities appreciated with uncertain underlying etiology.  Consulted hospitalist who agreed with need for admission and patient was admitted with no further acute events.   Disposition:   Based on the above findings, I believe this patient is stable for admission.    Patient/family educated about specific findings on our evaluation and explained exact reasons for admission.  Patient/family educated about clinical situation and time was allowed to answer questions.   Admission team communicated with and agreed with need for admission. Patient admitted. Patient  ready to move at this time.     Emergency Department Medication Summary:   Medications  albuterol (VENTOLIN HFA) 108 (90 Base) MCG/ACT inhaler 2 puff (has no administration in time range)  aspirin chewable tablet 324 mg (has no administration in time range)  lactated ringers bolus 1,000 mL (0 mLs Intravenous Stopped 04/22/22 1717)         Clinical Impression:  1. Heart failure, unspecified HF chronicity, unspecified heart failure type ([place])      Admit   Final Clinical Impression(s) / ED Diagnoses Final diagnoses:  Heart failure, unspecified HF chronicity, unspecified heart failure type [facility])    Rx / DC Orders ED Discharge Orders     None         Tretha Sciara, MD 04/22/22 2303

## 2022-04-22 NOTE — ED Notes (Signed)
US at bedside

## 2022-04-22 NOTE — Progress Notes (Signed)
Plan of Care Note for accepted transfer   Patient: Dwayne [name] MRN: [id number]   DOA: 04/22/2022  Facility requesting transfer: [facility]   Requesting Provider: Dr. Oswald Hillock   Reason for transfer: Suspected new heart failure, new a fib, cirrhosis, hypervolemia   Facility course: 88 yr old man with ILD, emphysema, HTN, and HLD who presents with progressive SOB despite starting doxycycline and prednisone 1/9 at the direction of pulmonology. Also became confused last night per family. He is found to have numerous lab abnormalities, most notably SCr 2.25, AST 310, ALT 316, t bili 2.4, BNP >4500, and troponin 275 then 260. He is in a fib on EKG (appears new). Bedside US by ED physician concerning for very low EF. CT head negative for acute findings. CT abd/pelvis concerning for cirrhosis and moderate b/l pleural effusions.   He was given 1 liter LR, ASA 324, and albuterol in ED. ED physician has paged cardiology and is waiting for call back.   Plan of care: The patient is accepted for admission to Telemetry unit, at [facility].   Author: Vianne Bulls, MD 04/22/2022  Check [email or web address] for on-call coverage.  Nursing staff, Please call [place] number on Amion as soon as patient's arrival, so appropriate admitting provider can evaluate the pt.

## 2022-04-22 NOTE — ED Triage Notes (Signed)
Congested ,fatigue, short of breath, no fevers noted per pt, this has been going on for the past 2 weeks, has progressively worsened. And family noticed confusion last  night.

## 2022-04-22 NOTE — ED Notes (Signed)
Pt refused ASA stated he is unable to chew and swallow them. Pt's breathing is labored and Pt is weak. Pt is confused and unable to follow commands well enough and unable to close his mouth long enough to get an oral temp. Temp was taken under arm instead. Pt does attempt to get over arm rails to get out of bed due to his confusion. Pt states he is unaware of what he wants or needs. Pt is also not sure where he is going when he does try getting out of bed.

## 2022-04-23 DIAGNOSIS — K7682 Hepatic encephalopathy: Secondary | ICD-10-CM | POA: Diagnosis present

## 2022-04-23 DIAGNOSIS — F17220 Nicotine dependence, chewing tobacco, uncomplicated: Secondary | ICD-10-CM | POA: Diagnosis present

## 2022-04-23 DIAGNOSIS — G9340 Encephalopathy, unspecified: Secondary | ICD-10-CM | POA: Diagnosis not present

## 2022-04-23 DIAGNOSIS — Z1152 Encounter for screening for COVID-19: Secondary | ICD-10-CM | POA: Diagnosis not present

## 2022-04-23 DIAGNOSIS — R7989 Other specified abnormal findings of blood chemistry: Secondary | ICD-10-CM | POA: Diagnosis not present

## 2022-04-23 DIAGNOSIS — N400 Enlarged prostate without lower urinary tract symptoms: Secondary | ICD-10-CM | POA: Diagnosis present

## 2022-04-23 DIAGNOSIS — E875 Hyperkalemia: Secondary | ICD-10-CM | POA: Diagnosis present

## 2022-04-23 DIAGNOSIS — J8410 Pulmonary fibrosis, unspecified: Secondary | ICD-10-CM | POA: Diagnosis present

## 2022-04-23 DIAGNOSIS — N179 Acute kidney failure, unspecified: Secondary | ICD-10-CM | POA: Diagnosis not present

## 2022-04-23 DIAGNOSIS — D509 Iron deficiency anemia, unspecified: Secondary | ICD-10-CM | POA: Diagnosis present

## 2022-04-23 DIAGNOSIS — R0602 Shortness of breath: Secondary | ICD-10-CM | POA: Diagnosis present

## 2022-04-23 DIAGNOSIS — I2510 Atherosclerotic heart disease of native coronary artery without angina pectoris: Secondary | ICD-10-CM | POA: Diagnosis present

## 2022-04-23 DIAGNOSIS — I639 Cerebral infarction, unspecified: Secondary | ICD-10-CM | POA: Diagnosis not present

## 2022-04-23 DIAGNOSIS — I4891 Unspecified atrial fibrillation: Secondary | ICD-10-CM | POA: Diagnosis present

## 2022-04-23 DIAGNOSIS — I6523 Occlusion and stenosis of bilateral carotid arteries: Secondary | ICD-10-CM | POA: Diagnosis present

## 2022-04-23 DIAGNOSIS — R188 Other ascites: Secondary | ICD-10-CM | POA: Diagnosis present

## 2022-04-23 DIAGNOSIS — J439 Emphysema, unspecified: Secondary | ICD-10-CM | POA: Diagnosis present

## 2022-04-23 DIAGNOSIS — R54 Age-related physical debility: Secondary | ICD-10-CM | POA: Diagnosis present

## 2022-04-23 DIAGNOSIS — E7800 Pure hypercholesterolemia, unspecified: Secondary | ICD-10-CM | POA: Diagnosis present

## 2022-04-23 DIAGNOSIS — I5021 Acute systolic (congestive) heart failure: Secondary | ICD-10-CM | POA: Diagnosis present

## 2022-04-23 DIAGNOSIS — E876 Hypokalemia: Secondary | ICD-10-CM | POA: Diagnosis not present

## 2022-04-23 DIAGNOSIS — I110 Hypertensive heart disease with heart failure: Secondary | ICD-10-CM | POA: Diagnosis present

## 2022-04-23 DIAGNOSIS — E871 Hypo-osmolality and hyponatremia: Secondary | ICD-10-CM | POA: Diagnosis not present

## 2022-04-23 DIAGNOSIS — I6381 Other cerebral infarction due to occlusion or stenosis of small artery: Secondary | ICD-10-CM | POA: Diagnosis not present

## 2022-04-23 DIAGNOSIS — E869 Volume depletion, unspecified: Secondary | ICD-10-CM | POA: Diagnosis present

## 2022-04-23 DIAGNOSIS — Z66 Do not resuscitate: Secondary | ICD-10-CM | POA: Diagnosis not present

## 2022-04-23 DIAGNOSIS — I352 Nonrheumatic aortic (valve) stenosis with insufficiency: Secondary | ICD-10-CM | POA: Diagnosis present

## 2022-04-23 DIAGNOSIS — I5020 Unspecified systolic (congestive) heart failure: Secondary | ICD-10-CM | POA: Diagnosis not present

## 2022-04-23 DIAGNOSIS — K7460 Unspecified cirrhosis of liver: Secondary | ICD-10-CM | POA: Diagnosis present

## 2022-04-23 LAB — COMPREHENSIVE METABOLIC PANEL
ALT: 305 U/L — ABNORMAL HIGH (ref 0–44)
AST: 297 U/L — ABNORMAL HIGH (ref 15–41)
Albumin: 3.5 g/dL (ref 3.5–5.0)
Alkaline Phosphatase: 180 U/L — ABNORMAL HIGH (ref 38–126)
Anion gap: 13 (ref 5–15)
BUN: 79 mg/dL — ABNORMAL HIGH (ref 8–23)
CO2: 20 mmol/L — ABNORMAL LOW (ref 22–32)
Calcium: 9.1 mg/dL (ref 8.9–10.3)
Chloride: 101 mmol/L (ref 98–111)
Creatinine, Ser: 2.16 mg/dL — ABNORMAL HIGH (ref 0.61–1.24)
GFR, Estimated: 29 mL/min — ABNORMAL LOW (ref >60)
Glucose, Bld: 94 mg/dL (ref 70–99)
Potassium: 5.4 mmol/L — ABNORMAL HIGH (ref 3.5–5.1)
Sodium: 134 mmol/L — ABNORMAL LOW (ref 135–145)
Total Bilirubin: 3.8 mg/dL — ABNORMAL HIGH (ref 0.3–1.2)
Total Protein: 5.8 g/dL — ABNORMAL LOW (ref 6.5–8.1)

## 2022-04-23 LAB — CBC
HCT: 35 % — ABNORMAL LOW (ref 39.0–52.0)
Hemoglobin: 12.2 g/dL — ABNORMAL LOW (ref 13.0–17.0)
MCH: 27.6 pg (ref 26.0–34.0)
MCHC: 34.9 g/dL (ref 30.0–36.0)
MCV: 79.2 fL — ABNORMAL LOW (ref 80.0–100.0)
Platelets: 119 10*3/uL — ABNORMAL LOW (ref 150–400)
RBC: 4.42 MIL/uL (ref 4.22–5.81)
RDW: 14.2 % (ref 11.5–15.5)
WBC: 7.6 10*3/uL (ref 4.0–10.5)
nRBC: 0.5 % — ABNORMAL HIGH (ref 0.0–0.2)

## 2022-04-23 MED ORDER — SODIUM CHLORIDE 0.9 % IV SOLN
1.0000 g | INTRAVENOUS | Status: DC
  Administered 2022-04-24 – 2022-04-26 (×3): 1 g via INTRAVENOUS
  Filled 2022-04-23 (×4): qty 10

## 2022-04-23 MED ORDER — ONDANSETRON HCL 4 MG/2ML IJ SOLN: 4.0000 mg | Freq: Four times a day (QID) | INTRAMUSCULAR | Status: DC | PRN

## 2022-04-23 MED ORDER — FUROSEMIDE 10 MG/ML IJ SOLN
20.0000 mg | Freq: Once | INTRAMUSCULAR | Status: AC
  Administered 2022-04-23: 20 mg via INTRAVENOUS
  Filled 2022-04-23: qty 2

## 2022-04-23 MED ORDER — ALBUTEROL SULFATE (2.5 MG/3ML) 0.083% IN NEBU: 2.5000 mg | INHALATION_SOLUTION | RESPIRATORY_TRACT | Status: DC | PRN

## 2022-04-23 MED ORDER — ONDANSETRON HCL 4 MG PO TABS: 4.0000 mg | ORAL_TABLET | Freq: Four times a day (QID) | ORAL | Status: DC | PRN

## 2022-04-23 MED ORDER — LACTULOSE 10 GM/15ML PO SOLN
30.0000 g | Freq: Once | ORAL | Status: AC
  Administered 2022-04-23: 30 g via ORAL
  Filled 2022-04-23: qty 60

## 2022-04-23 MED ORDER — SODIUM ZIRCONIUM CYCLOSILICATE 10 G PO PACK
10.0000 g | PACK | Freq: Once | ORAL | Status: AC
  Administered 2022-04-23: 10 g via ORAL
  Filled 2022-04-23: qty 1

## 2022-04-23 MED ORDER — DEXTROSE 5 % IV SOLN: INTRAVENOUS | Status: DC

## 2022-04-23 MED ORDER — SODIUM CHLORIDE 0.9 % IV SOLN
2.0000 g | Freq: Once | INTRAVENOUS | Status: AC
  Administered 2022-04-23: 2 g via INTRAVENOUS
  Filled 2022-04-23: qty 20

## 2022-04-23 NOTE — ED Notes (Signed)
Thomas at [facility] will send transport for pt.-Dwayne [name] (NS)

## 2022-04-23 NOTE — ED Notes (Signed)
Handoff report given to [facility] RN on 4E at [facility]

## 2022-04-23 NOTE — H&P (Signed)
History and Physical    Patient: Dwayne [name] GYJ:[id number] DOB: 07-15-1934 DOA: 04/22/2022 DOS: the patient was seen and examined on 04/23/2022 PCP: Deland Pretty, MD  Patient coming from: Home  Chief Complaint:  Chief Complaint  Patient presents with   Shortness of Breath   HPI: Dwayne [name] is a 88 y.o. male with medical history significant of abnormal EKG, RBBB, acute tear lateral meniscus, arctic regurgitation, arctic stenosis, arctic peripheral vascular disease, BPH, coronary atherosclerosis, chronic leukopenia, hyperlipidemia, hypertension, mild aortic valve regurgitation, nephrolithiasis who presented to the emergency department with progressively worse dyspnea for the past 2 weeks and confusion for the past 2 days.  He takes over-the-counter sleep aid, which his son initially thought that was the reason for the confusion.  However, his mental status continued to be confused.  He has had on and off periods of confusion since then.  He was only oriented to name when I saw him, but the patient's son stated that earlier in the afternoon he was a lot closer to his baseline mental state.  She is unable to elaborate about the HPI.  However, he is able to answer some simple questions now.  No headache, chest, back or abdominal pain.  He does not seem to be in acute distress.  ED course: Initial vital signs were temperature 97.5 F, pulse 101, respiration 18, BP 121/52 mmHg, O2 sat 97% on room air.  The patient received aspirin 324 mg p.o., furosemide 20 mg IVP, LR 1000 mL bolus, lactulose 30 g p.o. and 2 g of ceftriaxone IVPB.  Lab work: Coronavirus, RSV and influenza PCR negative.  Lactic acid was normal.  Acute hepatitis panel negative.  Urinalysis with trace proteinuria, but otherwise negative.  Ammonia level was 40 mol/L.  Lactic acid was normal.  CBC is her white count 7.4, hemoglobin 13.0 g/dL platelets 129.  CMP with a sodium 132 and CO2 21 mmol/L with a normal anion gap.  The rest of  the electrolytes were normal.  Glucose 133, BUN 76 and creatinine 2.25 mg/dL.  5 years ago his creatinine level was 1.3 mg/dL.  Total protein 6.5 and albumin 3.6 g/dL.  AST 310, ALT 316 and alkaline phosphatase 157 units/L.  Total bilirubin 2.4 mg/dL.  Imaging: 2 view chest radiograph with stable chronic interstitial lung disease and bibasilar scaring no acute findings.  Heart size and mediastinal contours are within normal limits.  CT head with advanced cerebral white matter disease, but no acute intracranial abnormality.  RUQ ultrasound showing findings suspicious for liver cirrhosis, small volume of perihepatic ascites, right pleural effusion, normal gallbladder with no cholelithiasis CBD not visualized.  CT abdomen and/pelvis without contrast showing hepatic cirrhosis, moderate bilateral pleural effusions, diverticulosis without diverticulitis and a mildly enlarged prostate.  Questionable tiny bilateral renal calculi without evidence of ureteral calculi or hydronephrosis.   Review of Systems: As mentioned in the history of present illness. All other systems reviewed and are negative.  Past Medical History:  Diagnosis Date   Abnormal EKG    Acute tear lateral meniscus    Aortic regurgitation    Aortic stenosis    Atherosclerotic peripheral vascular disease (HCC)    Benign prostatic hyperplasia    BPH (benign prostatic hyperplasia)    Carotid atherosclerosis    Chronic leukopenia    Heart murmur    History of smoking    Hypercholesterolemia    Hyperlipidemia    Hypertension    Mild aortic valve regurgitation    Nephrolithiasis  RBBB    History reviewed. No pertinent surgical history. Social History:  reports that he quit smoking about 51 years ago. His smoking use included cigarettes. He has a 10.00 pack-year smoking history. His smokeless tobacco use includes chew. He reports that he does not drink alcohol and does not use drugs.  Allergies  Allergen Reactions   Crestor  [Rosuvastatin Calcium]     myalgia   Lipitor [Atorvastatin Calcium]     myalgia   Trazodone And Nefazodone     Constipation     Family History  Problem Relation Age of Onset   Asthma Mother    Other Father        BRAIN TURMOR   Cancer - Other Father        MALIGNANT NEOPLASM    Prior to Admission medications   Medication Sig Start Date End Date Taking? Authorizing Provider  aspirin 81 MG EC tablet Take 81 mg by mouth daily.     [provider]  doxycycline (VIBRA-TABS) 100 MG tablet Take 1 tablet (100 mg total) by mouth 2 (two) times daily. 04/17/22   Hunsucker, Bonna Gains, MD  predniSONE (DELTASONE) 20 MG tablet Take 2 tablets (40 mg total) by mouth daily with breakfast for 5 days, THEN 1 tablet (20 mg total) daily with breakfast for 5 days. 04/17/22 04/27/22  Hunsucker, Bonna Gains, MD    Physical Exam: Vitals:   04/23/22 1204 04/23/22 1400 04/23/22 1442 04/23/22 1638  BP:  120/77  (!) 102/52  Pulse:  87  89  Resp:  (!) 25  18  Temp:   97.7 F (36.5 C) (!) 97.4 F (36.3 C)  TempSrc:   Tympanic Oral  SpO2: 96% 100%  99%   Physical Exam Vitals and nursing note reviewed.  Constitutional:      General: He is awake.     Appearance: He is normal weight. He is ill-appearing.  HENT:     Head: Normocephalic.     Nose: No rhinorrhea.     Mouth/Throat:     Mouth: Mucous membranes are moist.  Eyes:     General: Scleral icterus present.     Pupils: Pupils are equal, round, and reactive to light.  Neck:     Vascular: No JVD.  Cardiovascular:     Rate and Rhythm: Normal rate. Rhythm irregular.     Heart sounds: S1 normal and S2 normal.  Pulmonary:     Effort: Pulmonary effort is normal.     Breath sounds: Normal breath sounds. No wheezing, rhonchi or rales.  Abdominal:     General: Bowel sounds are normal. There is no distension.     Palpations: Abdomen is soft.     Tenderness: There is no abdominal tenderness. There is no guarding.  Musculoskeletal:     Cervical  back: Neck supple.     Right lower leg: No edema.     Left lower leg: No edema.  Skin:    General: Skin is warm and dry.  Neurological:     General: No focal deficit present.     Mental Status: He is alert and oriented to person, place, and time.  Psychiatric:        Mood and Affect: Mood normal. Mood is not anxious.        Behavior: Behavior normal. Behavior is cooperative.   Data Reviewed:  Results are pending, will review when available.  10/05/2020 echocardiogram IMPRESSIONS    1. Left ventricular ejection fraction, by estimation, is  55 to 60%. The  left ventricle has normal function. The left ventricle has no regional  wall motion abnormalities. There is mild left ventricular hypertrophy.  Left ventricular diastolic parameters  are consistent with Grade I diastolic dysfunction (impaired relaxation).   2. Right ventricular systolic function is normal. The right ventricular  size is normal.   3. Left atrial size was moderately dilated.   4. The mitral valve is abnormal. Trivial mitral valve regurgitation.   5. The aortic valve is tricuspid. Aortic valve regurgitation is mild to  moderate. Moderate to severe aortic valve stenosis. Aortic regurgitation  PHT measures 413 msec. Aortic valve area, by VTI measures 0.81 cm. Aortic  valve mean gradient measures 35.0   mmHg. Aortic valve Vmax measures 3.90 m/s. DI is 0.26.   EKG: Vent. rate 99 BPM PR interval * ms QRS duration 140 ms QT/QTcB 402/515 ms P-R-T axes * -47 125 Atrial fibrillation Left axis deviation Right bundle branch block Anterior infarct , age undetermined T wave abnormality, consider lateral ischemia Abnormal ECG  Assessment and Plan: Principal Problem:   Acute encephalopathy  Setting of:   Abnormal LFTs Significant brain white matter disease. Inpatient/telemetry. Monitor LFTs daily. Recheck ammonia level in a.m. Lactulose as needed. Check MRCP in the morning. Gastroenterology consult  requested.  Active Problems:   Elevated brain natriuretic peptide (BNP) level  The patient does not look overloaded. The patient actually looks slightly volume depleted. Will recheck a BNP level with the morning labs. Message sent to the cardiology master.    Elevated troponin  Echocardiogram requested. Cardiology consult for a.m.    Nonrheumatic aortic insufficiency with aortic stenosis Check echocardiogram in the morning.    Hypertension with heart disease The patient is not currently on antihypertensives. Monitor blood pressure closely.    Hypercholesterolemia Currently not on therapy. Was not pursued in the setting of, normal LFTs.    Microcytic anemia Monitor hematocrit and hemoglobin.    Hyperkalemia Lokelma 10 g p.o. x 1. Check potassium level in the morning.     Advance Care Planning:   Code Status: Full Code   Consults:   Family Communication: His son and daughter-in-law were present in the ED room.  Severity of Illness: The appropriate patient status for this patient is INPATIENT. Inpatient status is judged to be reasonable and necessary in order to provide the required intensity of service to ensure the patient's safety. The patient's presenting symptoms, physical exam findings, and initial radiographic and laboratory data in the context of their chronic comorbidities is felt to place them at high risk for further clinical deterioration. Furthermore, it is not anticipated that the patient will be medically stable for discharge from the hospital within 2 midnights of admission.   * I certify that at the point of admission it is my clinical judgment that the patient will require inpatient hospital care spanning beyond 2 midnights from the point of admission due to high intensity of service, high risk for further deterioration and high frequency of surveillance required.*  Author: Reubin Milan, MD 04/23/2022 4:50 PM  For on call review  [email or web address].   This document was prepared using Dragon voice recognition software and may contain some unintended transcription errors.

## 2022-04-23 NOTE — ED Notes (Signed)
[facility] at bedside 

## 2022-04-24 ENCOUNTER — Inpatient Hospital Stay (HOSPITAL_COMMUNITY): Payer: Medicare Other

## 2022-04-24 DIAGNOSIS — G9340 Encephalopathy, unspecified: Secondary | ICD-10-CM

## 2022-04-24 DIAGNOSIS — D509 Iron deficiency anemia, unspecified: Secondary | ICD-10-CM

## 2022-04-24 DIAGNOSIS — I6523 Occlusion and stenosis of bilateral carotid arteries: Secondary | ICD-10-CM

## 2022-04-24 DIAGNOSIS — I639 Cerebral infarction, unspecified: Secondary | ICD-10-CM

## 2022-04-24 DIAGNOSIS — I119 Hypertensive heart disease without heart failure: Secondary | ICD-10-CM

## 2022-04-24 DIAGNOSIS — R7989 Other specified abnormal findings of blood chemistry: Secondary | ICD-10-CM | POA: Diagnosis not present

## 2022-04-24 DIAGNOSIS — I352 Nonrheumatic aortic (valve) stenosis with insufficiency: Secondary | ICD-10-CM

## 2022-04-24 DIAGNOSIS — I350 Nonrheumatic aortic (valve) stenosis: Secondary | ICD-10-CM

## 2022-04-24 DIAGNOSIS — K7031 Alcoholic cirrhosis of liver with ascites: Secondary | ICD-10-CM

## 2022-04-24 DIAGNOSIS — I5020 Unspecified systolic (congestive) heart failure: Secondary | ICD-10-CM | POA: Diagnosis not present

## 2022-04-24 DIAGNOSIS — E875 Hyperkalemia: Secondary | ICD-10-CM

## 2022-04-24 DIAGNOSIS — E7800 Pure hypercholesterolemia, unspecified: Secondary | ICD-10-CM

## 2022-04-24 LAB — IRON AND TIBC
Iron: 26 ug/dL — ABNORMAL LOW (ref 45–182)
Saturation Ratios: 8 % — ABNORMAL LOW (ref 17.9–39.5)
TIBC: 322 ug/dL (ref 250–450)
UIBC: 296 ug/dL

## 2022-04-24 LAB — COMPREHENSIVE METABOLIC PANEL
ALT: 236 U/L — ABNORMAL HIGH (ref 0–44)
AST: 196 U/L — ABNORMAL HIGH (ref 15–41)
Albumin: 2.9 g/dL — ABNORMAL LOW (ref 3.5–5.0)
Alkaline Phosphatase: 145 U/L — ABNORMAL HIGH (ref 38–126)
Anion gap: 10 (ref 5–15)
BUN: 73 mg/dL — ABNORMAL HIGH (ref 8–23)
CO2: 20 mmol/L — ABNORMAL LOW (ref 22–32)
Calcium: 8.5 mg/dL — ABNORMAL LOW (ref 8.9–10.3)
Chloride: 104 mmol/L (ref 98–111)
Creatinine, Ser: 2.15 mg/dL — ABNORMAL HIGH (ref 0.61–1.24)
GFR, Estimated: 29 mL/min — ABNORMAL LOW (ref >60)
Glucose, Bld: 122 mg/dL — ABNORMAL HIGH (ref 70–99)
Potassium: 3.9 mmol/L (ref 3.5–5.1)
Sodium: 134 mmol/L — ABNORMAL LOW (ref 135–145)
Total Bilirubin: 2.3 mg/dL — ABNORMAL HIGH (ref 0.3–1.2)
Total Protein: 5.1 g/dL — ABNORMAL LOW (ref 6.5–8.1)

## 2022-04-24 LAB — CBC
HCT: 33.4 % — ABNORMAL LOW (ref 39.0–52.0)
Hemoglobin: 11.4 g/dL — ABNORMAL LOW (ref 13.0–17.0)
MCH: 27.5 pg (ref 26.0–34.0)
MCHC: 34.1 g/dL (ref 30.0–36.0)
MCV: 80.7 fL (ref 80.0–100.0)
Platelets: 106 10*3/uL — ABNORMAL LOW (ref 150–400)
RBC: 4.14 MIL/uL — ABNORMAL LOW (ref 4.22–5.81)
RDW: 14.2 % (ref 11.5–15.5)
WBC: 7.4 10*3/uL (ref 4.0–10.5)
nRBC: 0.3 % — ABNORMAL HIGH (ref 0.0–0.2)

## 2022-04-24 LAB — ECHOCARDIOGRAM COMPLETE
AR max vel: 0.55 cm2
AV Area VTI: 0.48 cm2
AV Area mean vel: 0.52 cm2
AV Mean grad: 41 mmHg
AV Peak grad: 73.1 mmHg
Ao pk vel: 4.28 m/s
Area-P 1/2: 6.04 cm2
Calc EF: 19.8 %
Est EF: <20
Height: 71 in
P 1/2 time: 332 msec
S' Lateral: 5.2 cm
Single Plane A2C EF: 10 %
Single Plane A4C EF: 28.6 %
Weight: 2310.42 oz

## 2022-04-24 LAB — FERRITIN: Ferritin: 38 ng/mL (ref 24–336)

## 2022-04-24 LAB — PROTIME-INR
INR: 1.9 — ABNORMAL HIGH (ref 0.8–1.2)
Prothrombin Time: 22 seconds — ABNORMAL HIGH (ref 11.4–15.2)

## 2022-04-24 LAB — BRAIN NATRIURETIC PEPTIDE: B Natriuretic Peptide: >4500 pg/mL — ABNORMAL HIGH (ref 0.0–100.0)

## 2022-04-24 LAB — HEPATITIS B SURFACE ANTIGEN: Hepatitis B Surface Ag: NONREACTIVE

## 2022-04-24 MED ORDER — PERFLUTREN LIPID MICROSPHERE
1.0000 mL | INTRAVENOUS | Status: AC | PRN
  Administered 2022-04-24: 5 mL via INTRAVENOUS

## 2022-04-24 MED ORDER — LACTULOSE 10 GM/15ML PO SOLN: 20.0000 g | Freq: Two times a day (BID) | ORAL | Status: DC | PRN

## 2022-04-24 MED ORDER — CLOPIDOGREL BISULFATE 75 MG PO TABS
75.0000 mg | ORAL_TABLET | Freq: Every day | ORAL | Status: DC
  Administered 2022-04-24 – 2022-04-26 (×3): 75 mg via ORAL
  Filled 2022-04-24 (×3): qty 1

## 2022-04-24 MED ORDER — FUROSEMIDE 10 MG/ML IJ SOLN
20.0000 mg | Freq: Two times a day (BID) | INTRAMUSCULAR | Status: DC
  Administered 2022-04-24 – 2022-04-26 (×3): 20 mg via INTRAVENOUS
  Filled 2022-04-24 (×4): qty 2

## 2022-04-24 MED ORDER — ASPIRIN 81 MG PO CHEW
81.0000 mg | CHEWABLE_TABLET | Freq: Every day | ORAL | Status: DC
  Administered 2022-04-24 – 2022-04-26 (×3): 81 mg via ORAL
  Filled 2022-04-24 (×3): qty 1

## 2022-04-24 NOTE — Progress Notes (Signed)
PROGRESS NOTE    Dwayne [name]  XTG:[id number] DOB: Feb 05, 1935 DOA: 04/22/2022 PCP: Deland Pretty, MD    Brief Narrative:  Dwayne [name] is a 88 y.o. male with medical history significant of abnormal EKG, RBBB, acute tear lateral meniscus, aortic regurgitation, aortic stenosis,  peripheral vascular disease, BPH, coronary atherosclerosis, chronic leukopenia, hyperlipidemia, hypertension, mild aortic valve regurgitation, nephrolithiasis presented to hospital with progressive shortness of breath and confusion.  Shortness of breath ongoing for 2 weeks or so.  In the ED patient was tachycardic.  Labs showed sodium of 132 creatinine elevated at 2.2, creatinine 5 years back was 1.3.  Chest x-ray showed chronic interstitial disease and bibasilar scaring.  CT head without any acute findings.  Right upper quadrant ultrasound shows cirrhosis of liver with ascites.  CT scan of the abdomen pelvis showed cirrhosis bilateral pleural effusion.  Patient was then considered for admission to the hospital for further evaluation and treatment.  Assessment and plan.  Acute encephalopathy . Elevated LFTs.  Question hepatic encephalopathy.  Scans with cirrhosis of liver.  Continue lactulose.  Check MRCP.  GI was consulted.  AST ALT elevated at 310 316.  Ammonia mildly elevated at 40.  COVID and influenza was negative.  WBC at 7.4.  Acute hepatitis panel negative.  Follow-up blood cultures.  Small acute infarct in the right frontal white matter.  On the background of Advanced to background chronic small vessel ischemic changes.  Patient was on aspirin as outpatient.  Will consult neurology.  Cirrhosis of liver.  Could be cardiac cirrhosis.  GI on board.  Ultrasound and CT shows cirrhotic changes with ascites.  MRI of the abdomen has been ordered.  GI ordering liver workup.  Starting lactulose.    Elevated brain natriuretic peptide (BNP) level  Cardiology has been notified.  Will add IV Lasix 20 twice daily.  Spoke  with cardiology.  2D echocardiogram with reduced LV function     Elevated troponin  2D echocardiogram section fraction of less than 20% with global hypokinesis with small pericardial effusion, severe aortic valve stenosis of 0.48 cm.     Nonrheumatic aortic insufficiency with aortic stenosis 2D echocardiogram with stenosis.  Spoke with cardiology.  Recommend transfer to [facility] for further evaluation due to multiple issues..     Hypertension with heart disease Not on antihypertensives.  Will closely monitor blood pressure.  Will add Lasix IV twice daily.     Hypercholesterolemia Currently not on any treatment.     Microcytic anemia Check CBC closely.     Hyperkalemia Patient received Lokelma 10 g p.o. x 1.  Potassium today at 3.9.  Check BMP in AM.  Elevated creatinine level. Unsure whether  this is a chronic progressive kidney disease versus AKI.  Will continue to monitor.  Urinalysis was negative for acute findings.  Will be starting on Lasix 20 twice daily.     DVT prophylaxis: SCDs Start: 04/23/22 2126   Code Status:     Code Status: Full Code  Disposition: Transfer to [facility]  Status is: Inpatient Remains inpatient appropriate because: Multiple issues including severe aortic stenosis, congestive heart failure, reduced LV function, stroke   Family Communication: Spoke with the patient's son at bedside.  Consultants:  Cardiology Neurology GI Palliative care  Procedures:  2D echocardiogram, MRI of the brain,   Antimicrobials:  Rocephin IV  Anti-infectives (From admission, onward)    Start     Dose/Rate Route Frequency Ordered Stop   04/24/22 0800  cefTRIAXone (  ROCEPHIN) 1 g in sodium chloride 0.9 % 100 mL IVPB        1 g 200 mL/hr over 30 Minutes Intravenous Every 24 hours 04/23/22 1723     04/23/22 0800  cefTRIAXone (ROCEPHIN) 2 g in sodium chloride 0.9 % 100 mL IVPB        2 g 200 mL/hr over 30 Minutes Intravenous  Once 04/23/22 0752 04/23/22  0903      Subjective: Today, patient was seen and examined at bedside.  Patient states that he feels okay.  Denies any pain, nausea, vomiting, increasing shortness of breath.  Patient's son at bedside.  Slow to respond.  Elderly male.  Objective: Vitals:   04/23/22 1800 04/23/22 2039 04/24/22 0040 04/24/22 0548  BP:  (!) 95/47 (!) 101/48 (!) 111/53  Pulse:  86 61 75  Resp:  20 (!) 22 (!) 22  Temp:  (!) 97.5 F (36.4 C) 97.6 F (36.4 C) (!) 97.5 F (36.4 C)  TempSrc:  Axillary Axillary Oral  SpO2:  98% 97% 97%  Weight: 65.5 kg     Height: '5\' 11"'$  (1.803 m)       Intake/Output Summary (Last 24 hours) at 04/24/2022 1205 Last data filed at 04/24/2022 0918 Gross per 24 hour  Intake 560.32 ml  Output 100 ml  Net 460.32 ml   Filed Weights   04/23/22 1800  Weight: 65.5 kg    Physical Examination: Body mass index is 20.14 kg/m.  General:  Average built, not in obvious distress, on room air appears weak and ill. HENT: Mild icterus noted.  Oral mucosa is moist.  Chest:  Clear breath sounds.  Diminished breath sounds bilaterally.  CVS: S1 &S2 heard.  Murmur noted, irregular rhythm. abdomen: Soft, nontender, nondistended.  Bowel sounds are heard.   Extremities: No cyanosis, clubbing or edema.  Peripheral pulses are palpable. Psych: Alert, awake and oriented, normal mood CNS:  No cranial nerve deficits.  Moves extremities.  Slow to respond, Skin: Warm and dry.  No rashes noted.  Data Reviewed:   CBC: Recent Labs  Lab 04/22/22 1354 04/23/22 0823 04/24/22 0501  WBC 7.4 7.6 7.4  NEUTROABS 6.7  --   --   HGB 13.0 12.2* 11.4*  HCT 37.5* 35.0* 33.4*  MCV 81.5 79.2* 80.7  PLT 129* 119* 106*    Basic Metabolic Panel: Recent Labs  Lab 04/22/22 1354 04/23/22 0823 04/24/22 0501  NA 132* 134* 134*  K 4.9 5.4* 3.9  CL 100 101 104  CO2 21* 20* 20*  GLUCOSE 133* 94 122*  BUN 76* 79* 73*  CREATININE 2.25* 2.16* 2.15*  CALCIUM 9.4 9.1 8.5*    Liver Function  Tests: Recent Labs  Lab 04/22/22 1354 04/23/22 0823 04/24/22 0501  AST 310* 297* 196*  ALT 316* 305* 236*  ALKPHOS 157* 180* 145*  BILITOT 2.4* 3.8* 2.3*  PROT 6.5 5.8* 5.1*  ALBUMIN 3.6 3.5 2.9*     Radiology Studies: ECHOCARDIOGRAM COMPLETE  Result Date: 04/24/2022    ECHOCARDIOGRAM REPORT   Patient Name:   Dwayne [name] Josephs Date of Exam: 04/24/2022 Medical Rec #:  [id number]      Height:       71.0 in Accession #:    [id number]     Weight:       144.4 lb Date of Birth:  [birth year]     BSA:          1.836 m Patient Age:    18 years  BP:           111/53 mmHg Patient Gender: M              HR:           75 bpm. Exam Location:  Inpatient Procedure: 2D Echo, Cardiac Doppler, Color Doppler, 3D Echo and Strain Analysis Indications:     Elevated Troponin  History:         Patient has prior history of Echocardiogram examinations, most                  recent 10/05/2020. CHF, Carotid Disease, Aortic Valve Disease,                  Arrythmias:RBBB; Risk Factors:Hypertension and Dyslipidemia.  Sonographer:     Darlina Sicilian RDCS Referring Phys:  [id number] DAVID Audelia Hives ORTIZ Diagnosing Phys: Vernell Leep MD IMPRESSIONS  1. Left ventricular ejection fraction, by estimation, is <20%. The left ventricle has severely decreased function. The left ventricle demonstrates global hypokinesis. The left ventricular internal cavity size was mildly dilated. Left ventricular diastolic parameters are indeterminate. Elevated left atrial pressure. The average left ventricular global longitudinal strain is -7.2 %. The global longitudinal strain is abnormal.  2. Right ventricular systolic function is mildly reduced. The right ventricular size is normal.  3. Left atrial size was mildly dilated.  4. Right atrial size was moderately dilated.  5. A small pericardial effusion is present. There is no evidence of cardiac tamponade.  6. The mitral valve is grossly normal. Moderate mitral valve regurgitation.  7. Tricuspid valve  regurgitation is moderate.  8. The aortic valve is calcified. Aortic valve regurgitation is moderate. Severe aortic valve stenosis. Aortic valve area, by VTI measures 0.48 cm. Aortic valve mean gradient measures 41.0 mmHg. Aortic valve Vmax measures 4.28 m/s.  9. The inferior vena cava is dilated in size with <50% respiratory variability, suggesting right atrial pressure of 15 mmHg. Comparison(s): Previous study on 10/05/2020 showed EF 55-60%, normal RV function, mod LA dilatation, trivial MR, no TR, mod-severe AS (Vmax 3.9 m/sec, DI 0.26). FINDINGS  Left Ventricle: Left ventricular ejection fraction, by estimation, is <20%. The left ventricle has severely decreased function. The left ventricle demonstrates global hypokinesis. The average left ventricular global longitudinal strain is -7.2 %. The global longitudinal strain is abnormal. The left ventricular internal cavity size was mildly dilated. There is no left ventricular hypertrophy. Left ventricular diastolic parameters are indeterminate. Elevated left atrial pressure. Right Ventricle: The right ventricular size is normal. No increase in right ventricular wall thickness. Right ventricular systolic function is mildly reduced. Left Atrium: Left atrial size was mildly dilated. Right Atrium: Right atrial size was moderately dilated. Pericardium: A small pericardial effusion is present. There is no evidence of cardiac tamponade. Mitral Valve: The mitral valve is grossly normal. Moderate mitral valve regurgitation. Tricuspid Valve: The tricuspid valve is grossly normal. Tricuspid valve regurgitation is moderate. Aortic Valve: The aortic valve is calcified. Aortic valve regurgitation is moderate. Aortic regurgitation PHT measures 332 msec. Severe aortic stenosis is present. Aortic valve mean gradient measures 41.0 mmHg. Aortic valve peak gradient measures 73.1 mmHg. Aortic valve area, by VTI measures 0.48 cm. Pulmonic Valve: The pulmonic valve was normal in  structure. Pulmonic valve regurgitation is not visualized. No evidence of pulmonic stenosis. Aorta: The aortic root is normal in size and structure. Venous: The inferior vena cava is dilated in size with less than 50% respiratory variability, suggesting right atrial pressure of 15 mmHg.  IAS/Shunts: The interatrial septum was not assessed.  LEFT VENTRICLE PLAX 2D LVIDd:         5.50 cm      Diastology LVIDs:         5.20 cm      LV e' medial:    3.94 cm/s LV PW:         1.00 cm      LV E/e' medial:  29.2 LV IVS:        1.10 cm      LV e' lateral:   5.10 cm/s LVOT diam:     1.90 cm      LV E/e' lateral: 22.5 LV SV:         41 LV SV Index:   23           2D Longitudinal Strain LVOT Area:     2.84 cm     2D Strain GLS (A2C):   -6.9 %                             2D Strain GLS (A3C):   -5.7 %                             2D Strain GLS (A4C):   -9.0 % LV Volumes (MOD)            2D Strain GLS Avg:     -7.2 % LV vol d, MOD A2C: 200.0 ml LV vol d, MOD A4C: 189.0 ml LV vol s, MOD A2C: 180.0 ml LV vol s, MOD A4C: 135.0 ml LV SV MOD A2C:     20.0 ml LV SV MOD A4C:     189.0 ml LV SV MOD BP:      38.9 ml RIGHT VENTRICLE RV Basal diam:  4.70 cm RV Mid diam:    2.60 cm RV S prime:     7.50 cm/s TAPSE (M-mode): 1.2 cm LEFT ATRIUM             Index        RIGHT ATRIUM           Index LA diam:        4.30 cm 2.34 cm/m   RA Area:     21.50 cm LA Vol (A2C):   74.6 ml 40.63 ml/m  RA Volume:   75.20 ml  40.96 ml/m LA Vol (A4C):   67.4 ml 36.71 ml/m LA Biplane Vol: 71.9 ml 39.16 ml/m  AORTIC VALVE AV Area (Vmax):    0.55 cm AV Area (Vmean):   0.52 cm AV Area (VTI):     0.48 cm AV Vmax:           427.50 cm/s AV Vmean:          298.000 cm/s AV VTI:            0.863 m AV Peak Grad:      73.1 mmHg AV Mean Grad:      41.0 mmHg LVOT Vmax:         83.30 cm/s LVOT Vmean:        54.200 cm/s LVOT VTI:          0.146 m LVOT/AV VTI ratio: 0.17 AI PHT:            332 msec  AORTA Ao Root diam: 3.00 cm Ao Asc diam:  3.10 cm MITRAL  VALVE                 TRICUSPID VALVE MV Area (PHT): 6.04 cm     TR Peak grad:   37.2 mmHg MV Decel Time: 126 msec     TR Vmax:        305.00 cm/s MV E velocity: 115.00 cm/s                             SHUNTS                             Systemic VTI:  0.15 m                             Systemic Diam: 1.90 cm Vernell Leep MD Electronically signed by Vernell Leep MD Signature Date/Time: 04/24/2022/11:42:29 AM    Final    MR BRAIN WO CONTRAST  Result Date: 04/24/2022 CLINICAL DATA:  Fatigue, congestion, altered mental status. EXAM: MRI HEAD WITHOUT CONTRAST TECHNIQUE: Multiplanar, multiecho pulse sequences of the brain and surrounding structures were obtained without intravenous contrast. COMPARISON:  CT head 2 days prior. FINDINGS: Brain: There is a small linear focus of diffusion restriction in the right frontal subcortical white matter (5-36) consistent with a small acute infarct. There is no other evidence of acute infarct. There is no acute intracranial hemorrhage or extra-axial fluid collection. There is background parenchymal volume loss with prominence of the ventricular system and extra-axial CSF spaces. There is confluent FLAIR signal abnormality throughout the supratentorial white matter, nonspecific but likely reflecting sequela of advanced chronic small-vessel ischemic change. There is a small remote lacunar infarct in the left thalamic capsular region. The pituitary and suprasellar region are normal. There is no mass lesion. There is no mass effect or midline shift. Vascular: Normal flow voids. Skull and upper cervical spine: Normal marrow signal. Sinuses/Orbits: The paranasal sinuses are clear. The globes and orbits are unremarkable. Other: There are left larger than right mastoid effusions. The imaged nasopharynx is unremarkable. IMPRESSION: 1. Small acute infarct in the right frontal white matter. 2. Advanced background chronic small-vessel ischemic change and remote infarct in the left thalamic  capsular region. 3. Left larger than right mastoid effusions. Electronically Signed   By: Valetta Mole M.D.   On: 04/24/2022 11:13   CT ABDOMEN PELVIS WO CONTRAST  Result Date: 04/22/2022 CLINICAL DATA:  Acute abdominal pain. EXAM: CT ABDOMEN AND PELVIS WITHOUT CONTRAST TECHNIQUE: Multidetector CT imaging of the abdomen and pelvis was performed following the standard protocol without IV contrast. RADIATION DOSE REDUCTION: This exam was performed according to the departmental dose-optimization program which includes automated exposure control, adjustment of the mA and/or kV according to patient size and/or use of iterative reconstruction technique. COMPARISON:  None Available. FINDINGS: Lower chest: Moderate bilateral pleural effusions. Hepatobiliary: Hepatic cirrhosis is demonstrated. No mass visualized on this unenhanced exam. Gallbladder is unremarkable. No evidence of biliary ductal dilatation. Pancreas: No mass or inflammatory process visualized on this unenhanced exam. Spleen:  Within normal limits in size. Adrenals/Urinary tract: Renal vascular calcification is noted, as well as a few probable tiny 1-2 mm bilateral renal calculi. No evidence of ureteral calculi or hydronephrosis. Unremarkable unopacified urinary bladder. Stomach/Bowel: No evidence of obstruction, inflammatory process, or abnormal fluid collections. Normal appendix visualized. Diverticulosis is seen mainly involving the sigmoid colon, however there is  no evidence of diverticulitis. Vascular/Lymphatic: No pathologically enlarged lymph nodes identified. No evidence of abdominal aortic aneurysm. Aortic atherosclerotic calcification incidentally noted. Reproductive:  Mildly enlarged prostate. Other:  Mild abdominal and pelvic ascites noted. Musculoskeletal: No suspicious bone lesions identified. Old T12 vertebral body compression deformity noted. IMPRESSION: Hepatic cirrhosis. Mild ascites. Moderate bilateral pleural effusions. Colonic  diverticulosis, without radiographic evidence of diverticulitis. Mildly enlarged prostate. Probable tiny bilateral renal calculi. No evidence of ureteral calculi or hydronephrosis. Electronically Signed   By: Marlaine Hind M.D.   On: 04/22/2022 16:46   US Abdomen Limited RUQ (LIVER/GB)  Result Date: 04/22/2022 CLINICAL DATA:  212411 Elevated liver enzymes 212411, pneumonia, fatigue EXAM: ULTRASOUND ABDOMEN LIMITED RIGHT UPPER QUADRANT COMPARISON:  None Available. FINDINGS: Gallbladder: No gallstones or wall thickening visualized. No sonographic Murphy sign noted by sonographer. Common bile duct: Not visualized Liver: Diffusely heterogeneous liver parenchyma with suggestion of fine liver surface irregularity, cannot exclude cirrhosis. No liver masses. Portal vein is patent on color Doppler imaging with normal direction of blood flow towards the liver. Other: Small volume perihepatic ascites. Evidence of right pleural effusion. IMPRESSION: 1. Diffusely heterogeneous liver parenchyma with suggestion of fine liver surface irregularity, suspect cirrhosis. No liver masses. 2. Small volume perihepatic ascites. 3. Right pleural effusion. 4. Normal gallbladder with no cholelithiasis. 5. Common bile duct not visualized. If bilirubin is elevated, further evaluation with CT abdomen/pelvis with IV contrast may be considered. Electronically Signed   By: Ilona Sorrel M.D.   On: 04/22/2022 16:37      LOS: 1 day     Dwayne Lipps, MD [facility] Available via Epic secure chat 7am-7pm After these hours, please refer to coverage provider listed on [email or web address] 04/24/2022, 12:05 PM

## 2022-04-24 NOTE — Consult Note (Addendum)
Referring Provider: [place] Primary Care Physician:  Deland Pretty, MD Primary Gastroenterologist: Althia Forts  Reason for Consultation: Cirrhosis, abnormal LFTs  HPI: Dwayne [name] is a 88 y.o. male with past medical history of interstitial lung disease, emphysema, hypertension presented to the hospital with worsening shortness of breath as well as some change in mental status. He was recently empirically treated for pneumonia versus exacerbation of emphysema with doxycycline and prednisone.  Upon initial evaluation on January 14 he was found to have elevated creatinine at 2.25, abnormal LFTs with AST of 310, ALT 316, alk phos 157 and T. bili of 2.4.  Mild elevated ammonia at 40.  CBC was normal except for low platelet count at 129.  Ultrasound right upper quadrant showed cirrhosis and small volume ascites.  CT abdomen pelvis without contrast again showed cirrhosis mild ascites, bilateral pleural effusion.  It showed normal gallbladder without any biliary dilation.  Blood work in 2022 with primary care physician showed normal LFTs.  Patient seen and examined at bedside.  Patient's son was at the bedside.  Most of the history obtained with patient's son.  Patient with no significant alcohol use.  No known liver disease.  No family history of liver disease.  Recently started having shortness of breath and some confusion.  Denies seeing any blood in the stool or black stool.  Denies abdominal pain.  Past Medical History:  Diagnosis Date   Abnormal EKG    Acute tear lateral meniscus    Aortic regurgitation    Aortic stenosis    Atherosclerotic peripheral vascular disease (HCC)    Benign prostatic hyperplasia    BPH (benign prostatic hyperplasia)    Carotid atherosclerosis    Chronic leukopenia    Heart murmur    History of smoking    Hypercholesterolemia    Hyperlipidemia    Hypertension    Mild aortic valve regurgitation    Nephrolithiasis    RBBB     History reviewed. No pertinent  surgical history.  Prior to Admission medications   Medication Sig Start Date End Date Taking? Authorizing Provider  aspirin 81 MG EC tablet Take 81 mg by mouth 3 (three) times a week.   Yes [provider]  doxycycline (VIBRA-TABS) 100 MG tablet Take 1 tablet (100 mg total) by mouth 2 (two) times daily. 04/17/22  Yes Hunsucker, Bonna Gains, MD  Doxylamine Succinate, Sleep, (SLEEP AID PO) Take 2 tablets by mouth at bedtime as needed (sleep).   Yes [provider]  predniSONE (DELTASONE) 20 MG tablet Take 2 tablets (40 mg total) by mouth daily with breakfast for 5 days, THEN 1 tablet (20 mg total) daily with breakfast for 5 days. 04/17/22 04/27/22 Yes Hunsucker, Bonna Gains, MD  zaleplon (SONATA) 5 MG capsule Take 5 mg by mouth at bedtime as needed. Patient not taking: Reported on 04/24/2022 04/12/22   [provider]    Scheduled Meds: Continuous Infusions:  cefTRIAXone (ROCEPHIN)  IV 1 g (04/24/22 0743)   PRN Meds:.albuterol, ondansetron **OR** ondansetron (ZOFRAN) IV, perflutren lipid microspheres (DEFINITY) IV suspension  Allergies as of 04/22/2022 - Review Complete 04/22/2022  Allergen Reaction Noted   Crestor [rosuvastatin calcium]  09/29/2018   Lipitor [atorvastatin calcium]  09/29/2018   Trazodone and nefazodone  09/29/2018    Family History  Problem Relation Age of Onset   Asthma Mother    Other Father        BRAIN TURMOR   Cancer - Other Father  MALIGNANT NEOPLASM    Social History   Socioeconomic History   Marital status: Widowed    Spouse name: Not on file   Number of children: 2   Years of education: Not on file   Highest education level: Not on file  Occupational History   Not on file  Tobacco Use   Smoking status: Former    Packs/day: 1.00    Years: 10.00    Total pack years: 10.00    Types: Cigarettes    Quit date: 05/22/1970    Years since quitting: 51.9   Smokeless tobacco: Current    Types: Chew  Vaping Use   Vaping Use:  Never used  Substance and Sexual Activity   Alcohol use: No   Drug use: No   Sexual activity: Not on file  Other Topics Concern   Not on file  Social History Narrative   Not on file   Social Determinants of Health   Financial Resource Strain: Not on file  Food Insecurity: No Food Insecurity (04/23/2022)   Hunger Vital Sign    Worried About Running Out of Food in the Last Year: Never true    Ran Out of Food in the Last Year: Never true  Transportation Needs: No Transportation Needs (04/23/2022)   PRAPARE - Hydrologist (Medical): No    Lack of Transportation (Non-Medical): No  Physical Activity: Not on file  Stress: Not on file  Social Connections: Not on file  Intimate Partner Violence: Not At Risk (04/23/2022)   Humiliation, Afraid, Rape, and Kick questionnaire    Fear of Current or Ex-Partner: No    Emotionally Abused: No    Physically Abused: No    Sexually Abused: No    Review of Systems: Limited review of system per HPI.  Physical Exam: Vital signs: Vitals:   04/24/22 0040 04/24/22 0548  BP: (!) 101/48 (!) 111/53  Pulse: 61 75  Resp: (!) 22 (!) 22  Temp: 97.6 F (36.4 C) (!) 97.5 F (36.4 C)  SpO2: 97% 97%   Last BM Date : 04/23/22 General:   resting comfortable in the bed, not in acute distress Lungs: No visible respiratory distress Heart:  Regular rate and rhythm; no murmurs, clicks, rubs,  or gallops. Abdomen: Mild distended abdomen but otherwise it is soft, nontender, bowel sounds present, no peritoneal signs Significant lower extremity edema Neurological exam -slow to respond but otherwise alert. Rectal:  Deferred  GI:  Lab Results: Recent Labs    04/22/22 1354 04/23/22 0823 04/24/22 0501  WBC 7.4 7.6 7.4  HGB 13.0 12.2* 11.4*  HCT 37.5* 35.0* 33.4*  PLT 129* 119* 106*   BMET Recent Labs    04/22/22 1354 04/23/22 0823 04/24/22 0501  NA 132* 134* 134*  K 4.9 5.4* 3.9  CL 100 101 104  CO2 21* 20* 20*   GLUCOSE 133* 94 122*  BUN 76* 79* 73*  CREATININE 2.25* 2.16* 2.15*  CALCIUM 9.4 9.1 8.5*   LFT Recent Labs    04/24/22 0501  PROT 5.1*  ALBUMIN 2.9*  AST 196*  ALT 236*  ALKPHOS 145*  BILITOT 2.3*   PT/INR No results for input(s): "LABPROT", "INR" in the last 72 hours.   Studies/Results: CT ABDOMEN PELVIS WO CONTRAST  Result Date: 04/22/2022 CLINICAL DATA:  Acute abdominal pain. EXAM: CT ABDOMEN AND PELVIS WITHOUT CONTRAST TECHNIQUE: Multidetector CT imaging of the abdomen and pelvis was performed following the standard protocol without IV contrast. RADIATION DOSE  REDUCTION: This exam was performed according to the departmental dose-optimization program which includes automated exposure control, adjustment of the mA and/or kV according to patient size and/or use of iterative reconstruction technique. COMPARISON:  None Available. FINDINGS: Lower chest: Moderate bilateral pleural effusions. Hepatobiliary: Hepatic cirrhosis is demonstrated. No mass visualized on this unenhanced exam. Gallbladder is unremarkable. No evidence of biliary ductal dilatation. Pancreas: No mass or inflammatory process visualized on this unenhanced exam. Spleen:  Within normal limits in size. Adrenals/Urinary tract: Renal vascular calcification is noted, as well as a few probable tiny 1-2 mm bilateral renal calculi. No evidence of ureteral calculi or hydronephrosis. Unremarkable unopacified urinary bladder. Stomach/Bowel: No evidence of obstruction, inflammatory process, or abnormal fluid collections. Normal appendix visualized. Diverticulosis is seen mainly involving the sigmoid colon, however there is no evidence of diverticulitis. Vascular/Lymphatic: No pathologically enlarged lymph nodes identified. No evidence of abdominal aortic aneurysm. Aortic atherosclerotic calcification incidentally noted. Reproductive:  Mildly enlarged prostate. Other:  Mild abdominal and pelvic ascites noted. Musculoskeletal: No  suspicious bone lesions identified. Old T12 vertebral body compression deformity noted. IMPRESSION: Hepatic cirrhosis. Mild ascites. Moderate bilateral pleural effusions. Colonic diverticulosis, without radiographic evidence of diverticulitis. Mildly enlarged prostate. Probable tiny bilateral renal calculi. No evidence of ureteral calculi or hydronephrosis. Electronically Signed   By: Marlaine Hind M.D.   On: 04/22/2022 16:46   US Abdomen Limited RUQ (LIVER/GB)  Result Date: 04/22/2022 CLINICAL DATA:  212411 Elevated liver enzymes 212411, pneumonia, fatigue EXAM: ULTRASOUND ABDOMEN LIMITED RIGHT UPPER QUADRANT COMPARISON:  None Available. FINDINGS: Gallbladder: No gallstones or wall thickening visualized. No sonographic Murphy sign noted by sonographer. Common bile duct: Not visualized Liver: Diffusely heterogeneous liver parenchyma with suggestion of fine liver surface irregularity, cannot exclude cirrhosis. No liver masses. Portal vein is patent on color Doppler imaging with normal direction of blood flow towards the liver. Other: Small volume perihepatic ascites. Evidence of right pleural effusion. IMPRESSION: 1. Diffusely heterogeneous liver parenchyma with suggestion of fine liver surface irregularity, suspect cirrhosis. No liver masses. 2. Small volume perihepatic ascites. 3. Right pleural effusion. 4. Normal gallbladder with no cholelithiasis. 5. Common bile duct not visualized. If bilirubin is elevated, further evaluation with CT abdomen/pelvis with IV contrast may be considered. Electronically Signed   By: Ilona Sorrel M.D.   On: 04/22/2022 16:37   DG Chest 2 View  Result Date: 04/22/2022 CLINICAL DATA:  Cough and shortness of breath for 2 weeks. Chronic interstitial lung disease. EXAM: CHEST - 2 VIEW COMPARISON:  04/17/2022 FINDINGS: The heart size and mediastinal contours are within normal limits. Chronic interstitial lung disease remains stable in appearance. No evidence of acute or superimposed  pulmonary opacity. Stable bibasilar scarring, without evidence of pleural effusion. IMPRESSION: Stable chronic interstitial lung disease and bibasilar scarring. No acute findings. Electronically Signed   By: Marlaine Hind M.D.   On: 04/22/2022 12:21   CT Head Wo Contrast  Result Date: 04/22/2022 CLINICAL DATA:  88 year old male with altered mental status. EXAM: CT HEAD WITHOUT CONTRAST TECHNIQUE: Contiguous axial images were obtained from the base of the skull through the vertex without intravenous contrast. RADIATION DOSE REDUCTION: This exam was performed according to the departmental dose-optimization program which includes automated exposure control, adjustment of the mA and/or kV according to patient size and/or use of iterative reconstruction technique. COMPARISON:  None Available. FINDINGS: Brain: No midline shift, ventriculomegaly, mass effect, evidence of mass lesion, intracranial hemorrhage or evidence of cortically based acute infarction. Confluent bilateral cerebral white matter hypodensity. Bilateral deep  white matter capsule involvement. Deep gray nuclei seem relatively spared. Posterior fossa gray-white differentiation within normal limits. No cortical encephalomalacia is identified. Vascular: Extensive Calcified atherosclerosis at the skull base. No suspicious intracranial vascular hyperdensity. Skull: Mild skull base motion artifact. No acute osseous abnormality identified. Sinuses/Orbits: Visualized paranasal sinuses and mastoids are clear. Other: Visualized orbits and scalp soft tissues are within normal limits. IMPRESSION: 1. Advanced cerebral white matter disease, most commonly due to small vessel ischemia. 2.  No acute intracranial abnormality identified. Electronically Signed   By: Genevie Ann M.D.   On: 04/22/2022 12:16    Impression/Plan: -Abnormal LFTs most likely from newly diagnosed cirrhosis.  No significant alcohol use according to patient's son. -Encephalopathy.  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 -Mild ascites.  Do not think there is enough ascites for paracentesis. -Shortness of breath.  Factorial from interstitial lung disease, questionable recent pneumonia and possible CHF.  Recommendations ------------------------ -Secondary marker for liver disease to rule out underlying etiology for his cirrhosis. -Check INR to calculate MELD score.  Check alpha-1 antitrypsin phenotype given underlying lung disease. -Not a candidate for EGD at this time because of recent worsening of shortness of breath -Start lactulose for hepatic encephalopathy.  Titrate to have 2-3 soft bowel movements per day. -Follow MRI MRCP.  Most likely it will show cirrhosis without any significant other findings. -Complications of cirrhosis such as ascites, encephalopathy, jaundice, bleeding and increased risk of liver cancer discussed with the patient's son. -I do not think he is a candidate for liver transplant given his advanced age and underlying interstitial lung disease.  This was also discussed with patient's son. -GI will follow      LOS: 1 day   Otis Brace  MD, FACP 04/24/2022, 9:33 AM  Contact #  [phone]

## 2022-04-24 NOTE — Progress Notes (Signed)
  Echocardiogram 2D Echocardiogram has been performed.  Dwayne [name] M 04/24/2022, 9:12 AM

## 2022-04-24 NOTE — Plan of Care (Signed)
  Problem: Clinical Measurements: Goal: Respiratory complications will improve Outcome: Progressing Goal: Cardiovascular complication will be avoided Outcome: Progressing   Problem: Activity: Goal: Risk for activity intolerance will decrease Outcome: Progressing   Problem: Nutrition: Goal: Adequate nutrition will be maintained Outcome: Progressing   Problem: Elimination: Goal: Will not experience complications related to bowel motility Outcome: Progressing   Problem: Pain Managment: Goal: General experience of comfort will improve Outcome: Progressing

## 2022-04-24 NOTE — Consult Note (Addendum)
CARDIOLOGY CONSULT NOTE  Patient ID: Dwayne [name] MRN: [id number] DOB/AGE: 11-Jun-1934 88 y.o.  Admit date: 04/22/2022 Referring Physician: [facility] hospitalist Primary Physician: Dwayne Pretty, MD Reason for Consultation:  Aortic stenosis, HFrEF  HPI:   88 y.o. Caucasian male with hypertension, hyperlipidemia, carotid atherosclerosis, stage IIIb kidney disease, severe aortic stenosis, new diagnosis HFrEF, admitted with confusion, new diagnosis of liver cirrhosis, acute stroke in the setting of new diagnosis Afib  Patient seen today by me at bedside on hospital.  Patient's son and daughter-in-law present at bedside and assisted with history taking.  His last echocardiogram was in 09/2020 that showed moderate to severe aortic stenosis, normal LVEF.  I last saw the patient in 10/2021. At that time, echocardiogram was recommended for follow-up of severe aortic stenosis, asymptomatic at that time.  It appears that patient had canceled this echocardiogram due to certain reasons and had not undergone any echocardiogram subsequently.  It appears that patient had cough which was thought to be due to upper respiratory infection about 2 weeks ago.  Subsequently, patient started getting very confused and disoriented since last Saturday, as noticed by his son, which prompted ER visit.  Subsequent workup has since showed rate controlled A-fib, BNP >4500, troponin >200, Chest x-ray with stable chronic interstitial lung disease and bibasilar scarring, without acute findings, CT and MRI abdomen with liver cirrhosis, MRI brain with acute right frontal matter stroke.  At present, patient denies any chest pain, shortness of breath, orthopnea, PND, leg edema symptoms.  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  Confusion is new according to patient's son.  He had also been seeing Dr. Lake [name] outpatient for what was thought to be relatively stable  pulmonary fibrosis.  Past Medical History:  Diagnosis Date   Abnormal EKG    Acute tear lateral meniscus    Aortic regurgitation    Aortic stenosis    Atherosclerotic peripheral vascular disease (HCC)    Benign prostatic hyperplasia    BPH (benign prostatic hyperplasia)    Carotid atherosclerosis    Chronic leukopenia    Heart murmur    History of smoking    Hypercholesterolemia    Hyperlipidemia    Hypertension    Mild aortic valve regurgitation    Nephrolithiasis    RBBB      History reviewed. No pertinent surgical history.    Family History  Problem Relation Age of Onset   Asthma Mother    Other Father        BRAIN TURMOR   Cancer - Other Father        MALIGNANT NEOPLASM     Social History: Social History   Socioeconomic History   Marital status: Widowed    Spouse name: Not on file   Number of children: 2   Years of education: Not on file   Highest education level: Not on file  Occupational History   Not on file  Tobacco Use   Smoking status: Former    Packs/day: 1.00    Years: 10.00    Total pack years: 10.00    Types: Cigarettes    Quit date: 05/22/1970    Years since quitting: 51.9   Smokeless tobacco: Current    Types: Chew  Vaping Use   Vaping Use: Never used  Substance and Sexual Activity   Alcohol use: No   Drug use: No   Sexual activity: Not on file  Other Topics Concern   Not on file  Social History Narrative   Not on file   Social Determinants of Health   Financial Resource Strain: Not on file  Food Insecurity: No Food Insecurity (04/23/2022)   Hunger [name] Sign    Worried About Running Out of Food in the Last Year: Never true    Ran Out of Food in the Last Year: Never true  Transportation Needs: No Transportation Needs (04/23/2022)   PRAPARE - Hydrologist (Medical): No    Lack of Transportation (Non-Medical): No  Physical Activity: Not on file  Stress: Not on file  Social Connections: Not on file   Intimate Partner Violence: Not At Risk (04/23/2022)   Humiliation, Afraid, Rape, and Kick questionnaire    Fear of Current or Ex-Partner: No    Emotionally Abused: No    Physically Abused: No    Sexually Abused: No     Medications Prior to Admission  Medication Sig Dispense Refill Last Dose   aspirin 81 MG EC tablet Take 81 mg by mouth 3 (three) times a week.   Past Week   doxycycline (VIBRA-TABS) 100 MG tablet Take 1 tablet (100 mg total) by mouth 2 (two) times daily. 14 tablet 0 04/21/2022   Doxylamine Succinate, Sleep, (SLEEP AID PO) Take 2 tablets by mouth at bedtime as needed (sleep).   04/18/2022   predniSONE (DELTASONE) 20 MG tablet Take 2 tablets (40 mg total) by mouth daily with breakfast for 5 days, THEN 1 tablet (20 mg total) daily with breakfast for 5 days. 15 tablet 0 04/21/2022   zaleplon (SONATA) 5 MG capsule Take 5 mg by mouth at bedtime as needed. (Patient not taking: Reported on 04/24/2022)   Not Taking    Review of Systems  Cardiovascular:  Negative for chest pain, dyspnea on exertion, leg swelling, palpitations and syncope.  Neurological:        Confusion States that it is 44 States that this is spring      Physical Exam: Physical Exam Vitals and nursing note reviewed.  Constitutional:      General: He is not in acute distress. Neck:     Vascular: No JVD.  Cardiovascular:     Rate and Rhythm: Normal rate. Rhythm irregular.     Heart sounds: Murmur heard.     Harsh midsystolic murmur is present with a grade of 3/6 at the upper right sternal border radiating to the neck.  Pulmonary:     Effort: Pulmonary effort is normal.     Breath sounds: Examination of the right-lower field reveals decreased breath sounds. Examination of the left-lower field reveals decreased breath sounds. Decreased breath sounds present. No wheezing or rales.  Abdominal:     General: There is distension (Mild).  Musculoskeletal:     Right lower leg: No edema.     Left lower leg: No  edema.        Imaging/tests reviewed and independently interpreted: Lab Results: CBC, BMP, trop HS, BNP  MRI brain 04/24/2022: 1. Small acute infarct in the right frontal white matter. 2. Advanced background chronic small-vessel ischemic change and remote infarct in the left thalamic capsular region. 3. Left larger than right mastoid effusions.  MRI abdomen 04/24/2022: 1. No biliary ductal dilation. No evidence of choledocholithiasis, although motion artifact somewhat limits evaluation. 2. Cirrhotic liver morphology. 3. Bilateral pleural effusions and trace abdominal ascites.        Cardiac Studies:  Telemetry 04/12/2022: Afib  with controlled ventricular rate  EKG 04/22/2022: Atrial fibrillation with controlled ventricular rate RBBB Possible anteroseptal infarct Anterolateral ST depression, consider ischemia  Echocardiogram 04/24/2022: 1. Left ventricular ejection fraction, by estimation, is <20%. The left  ventricle has severely decreased function. The left ventricle demonstrates  global hypokinesis. The left ventricular internal cavity size was mildly  dilated. Left ventricular  diastolic parameters are indeterminate. Elevated left atrial pressure. The  average left ventricular global longitudinal strain is -7.2 %. The global  longitudinal strain is abnormal.   2. Right ventricular systolic function is mildly reduced. The right  ventricular size is normal.   3. Left atrial size was mildly dilated.   4. Right atrial size was moderately dilated.   5. A small pericardial effusion is present. There is no evidence of  cardiac tamponade.   6. The mitral valve is grossly normal. Moderate mitral valve  regurgitation.   7. Tricuspid valve regurgitation is moderate.   8. The aortic valve is calcified. Aortic valve regurgitation is moderate.  Severe aortic valve stenosis. Aortic valve area, by VTI measures 0.48 cm.  Aortic valve mean gradient measures 41.0 mmHg. Aortic valve  Vmax measures  4.28 m/s.   9. The inferior vena cava is dilated in size with <50% respiratory  variability, suggesting right atrial pressure of 15 mmHg.   Comparison(s): Previous study on 10/05/2020 showed EF 55-60%, normal RV  function, mod LA dilatation, trivial MR, no TR, mod-severe AS (Vmax 3.9  m/sec, DI 0.26).    Assessment & Recommendations:  88 y.o. Caucasian male with hypertension, hyperlipidemia, carotid atherosclerosis, stage IIIb kidney disease, severe aortic stenosis, new diagnosis HFrEF, admitted with confusion, new diagnosis of liver cirrhosis, acute stroke in the setting of new diagnosis Afib  Severe AS, valvular cardiomyopathy: Progression of aortic stenosis, now to severe, most likely cause for HFrEF with EF <20%.  Concurrently, there is also moderate to severe MR, TR. Unless proven otherwise, AAS is most likely because of HFrEF with valvular cardiomyopathy, although ischemic cardiomyopathy cannot be excluded without ischemia workup/coronary evaluation. In perfect case scenario, he would certainly have benefited from aortic valve replacement, possibly TAVR. Decision making is complicated at this time due to multiple other medical issues, possibly related to his cardiac comorbidity.  He has new diagnosis A-fib, new frontal stroke, AKI/CKD, liver cirrhosis.  In absence of any known alcohol history, hepatitis history, cardiac cirrhosis remains likely. Most importantly, there is confusion which is relatively new.  This could be combination of recent stroke, liver cirrhosis with mildly elevated ammonia, or underlying and previously undiagnosed dementia.  I discussed natural history of severe aortic stenosis with the patient and family.  That said, he is unlikely to be immediately be candidate for TAVR given his other other comorbidities.  While we continue to consider options for aggressive versus conservative/palliative therapy, I recommend palliative care on board.  Continue IV  diuresis with IV Lasix 20 mg twice daily.  Will need to see how his renal function, elevated liver enzymes, and confusion panel for rest of the hospitalization.  Overall, without undergoing aortic replacement, his prognosis is guarded and expected survival is about 12 months.  Should we indeed proceed with workup and management for severe external cyst, he will likely need outpatient workup of with CTA, heart catheterization, renal function permitting.  In the given complicated situation, if patient and family decide to not pursue corrective management given his advanced age and comorbidities, he would likely benefit from hospice.  Atrial fibrillation: New diagnosis,  high [place] Vascor. If okay with neurology, could start on IV heparin, with eventual transition to DOAC, would recommend Eliquis 2.5 mg twice daily.  I will continue to follow the patient along. He will likely be transferred to [facility] for further evaluation and management.   Time spent: 90 min  Discussed interpretation of tests and management recommendations with the primary team     Nigel Mormon, MD Pager: [phone] Office: [phone]

## 2022-04-24 NOTE — Hospital Course (Addendum)
Dwayne [name] is a 88 y.o. male with medical history significant of abnormal EKG, RBBB, acute tear lateral meniscus, aortic regurgitation, aortic stenosis,  peripheral vascular disease, BPH, coronary atherosclerosis, chronic leukopenia, hyperlipidemia, hypertension, mild aortic valve regurgitation, nephrolithiasis presented to hospital with progressive shortness of breath and confusion.  Shortness of breath ongoing for 2 weeks or so.  In the ED patient was tachycardic.  Labs showed sodium of 132 creatinine elevated at 2.2, creatinine 5 years back was 1.3.  Chest x-ray showed chronic interstitial disease and bibasilar scaring.  CT head without any acute findings.  Right upper quadrant ultrasound shows cirrhosis of liver with ascites.  CT scan of the abdomen pelvis showed cirrhosis bilateral pleural effusion.  Patient was then considered for admission to the hospital for further evaluation and treatment.  Assessment and plan.  Acute encephalopathy . Elevated LFTs.  Question hepatic encephalopathy.  History of cirrhosis of liver.  Continue lactulose.  Check MRCP.  GI was consulted.  AST ALT elevated at 310 316.  Ammonia mildly elevated at 40.  COVID and influenza was negative.  WBC at 7.4.  Acute hepatitis panel negative.  Follow-up blood cultures.  Small acute infarct in the right frontal white matter.  Advanced to background chronic small vessel ischemic changes.  Will add aspirin.  Will consult neurology.  Cirrhosis of liver.  Could be cardiac cirrhosis.  GI on board.  Ultrasound and CT shows cirrhotic changes with ascites.  MRI of the abdomen has been ordered.  GI ordering liver workup.  Starting lactulose.    Elevated brain natriuretic peptide (BNP) level  Cardiology has been notified.  Will add IV Lasix 20 twice daily.  Discussed with cardiology.     Elevated troponin  2D echocardiogram section fraction of less than 20% with global hypokinesis with small pericardial effusion, severe aortic valve  stenosis of 0.48 cm.     Nonrheumatic aortic insufficiency with aortic stenosis 2D echocardiogram with stenosis.  Spoke with cardiology.  Recommend transfer to [facility] for further evaluation..     Hypertension with heart disease Not on antihypertensives.  Will closely monitor blood pressure.  Will add Lasix IV twice daily.     Hypercholesterolemia Currently not on any treatment.     Microcytic anemia Check CBC closely.     Hyperkalemia Patient received Lokelma 10 g p.o. x 1.  Potassium today at 3.9.  Check BMP in AM.  Elevated creatinine level. Unsure whether  this is a chronic progressive kidney disease versus AKI.  Will continue to monitor.  Urinalysis was negative for acute findings.  Will be starting on Lasix 20 twice daily.

## 2022-04-25 DIAGNOSIS — R7989 Other specified abnormal findings of blood chemistry: Secondary | ICD-10-CM | POA: Diagnosis not present

## 2022-04-25 DIAGNOSIS — G9340 Encephalopathy, unspecified: Secondary | ICD-10-CM | POA: Diagnosis not present

## 2022-04-25 DIAGNOSIS — I639 Cerebral infarction, unspecified: Secondary | ICD-10-CM | POA: Diagnosis not present

## 2022-04-25 DIAGNOSIS — D509 Iron deficiency anemia, unspecified: Secondary | ICD-10-CM | POA: Diagnosis not present

## 2022-04-25 DIAGNOSIS — I5020 Unspecified systolic (congestive) heart failure: Secondary | ICD-10-CM | POA: Diagnosis not present

## 2022-04-25 LAB — HCV AB W REFLEX TO QUANT PCR: HCV Ab: NONREACTIVE

## 2022-04-25 LAB — COMPREHENSIVE METABOLIC PANEL
ALT: 183 U/L — ABNORMAL HIGH (ref 0–44)
AST: 130 U/L — ABNORMAL HIGH (ref 15–41)
Albumin: 2.7 g/dL — ABNORMAL LOW (ref 3.5–5.0)
Alkaline Phosphatase: 138 U/L — ABNORMAL HIGH (ref 38–126)
Anion gap: 11 (ref 5–15)
BUN: 57 mg/dL — ABNORMAL HIGH (ref 8–23)
CO2: 21 mmol/L — ABNORMAL LOW (ref 22–32)
Calcium: 8.2 mg/dL — ABNORMAL LOW (ref 8.9–10.3)
Chloride: 102 mmol/L (ref 98–111)
Creatinine, Ser: 1.84 mg/dL — ABNORMAL HIGH (ref 0.61–1.24)
GFR, Estimated: 35 mL/min — ABNORMAL LOW (ref >60)
Glucose, Bld: 83 mg/dL (ref 70–99)
Potassium: 3.5 mmol/L (ref 3.5–5.1)
Sodium: 134 mmol/L — ABNORMAL LOW (ref 135–145)
Total Bilirubin: 2.2 mg/dL — ABNORMAL HIGH (ref 0.3–1.2)
Total Protein: 4.9 g/dL — ABNORMAL LOW (ref 6.5–8.1)

## 2022-04-25 LAB — CBC
HCT: 31.5 % — ABNORMAL LOW (ref 39.0–52.0)
Hemoglobin: 11.1 g/dL — ABNORMAL LOW (ref 13.0–17.0)
MCH: 27.8 pg (ref 26.0–34.0)
MCHC: 35.2 g/dL (ref 30.0–36.0)
MCV: 78.8 fL — ABNORMAL LOW (ref 80.0–100.0)
Platelets: 90 10*3/uL — ABNORMAL LOW (ref 150–400)
RBC: 4 MIL/uL — ABNORMAL LOW (ref 4.22–5.81)
RDW: 14.4 % (ref 11.5–15.5)
WBC: 6.5 10*3/uL (ref 4.0–10.5)
nRBC: 0 % (ref 0.0–0.2)

## 2022-04-25 LAB — HCV INTERPRETATION

## 2022-04-25 LAB — MITOCHONDRIAL ANTIBODIES: Mitochondrial M2 Ab, IgG: <20 Units (ref 0.0–20.0)

## 2022-04-25 LAB — ANTI-SMOOTH MUSCLE ANTIBODY, IGG: F-Actin IgG: 4 Units (ref 0–19)

## 2022-04-25 LAB — CERULOPLASMIN: Ceruloplasmin: 32.3 mg/dL — ABNORMAL HIGH (ref 16.0–31.0)

## 2022-04-25 MED ORDER — ALBUMIN HUMAN 25 % IV SOLN
100.0000 g | Freq: Once | INTRAVENOUS | Status: AC
  Administered 2022-04-25: 100 g via INTRAVENOUS
  Filled 2022-04-25 (×2): qty 400

## 2022-04-25 NOTE — Evaluation (Signed)
Physical Therapy Evaluation Patient Details Name: Dwayne [name] MRN: [id number] DOB: 1934-06-25 Today's Date: 04/25/2022  History of Present Illness  88 y.o. male with medical history significant of abnormal EKG, RBBB, acute tear lateral meniscus, aortic regurgitation, aortic stenosis,  peripheral vascular disease, BPH, coronary atherosclerosis, chronic leukopenia, hyperlipidemia, hypertension, mild aortic valve regurgitation, nephrolithiasis presented to hospital with progressive shortness of breath and confusion. Patietn admitted with Acute encephalopathy .and abnormal LFTs. Patient found to have Small acute infarct in the right frontal white matter  Clinical Impression  On eval, pt required Min guard-Min A for mobility. He walked ~350 feet with use of IV pole and hallway handrail for support. Discussed possible need for RW with family (daughter-n law reports pt already has one at home). Pt participated well. Mobility baseline with Ind with ambulation without a device. Will plan to follow pt during this hospital stay. Family is working on d/c plan home potentially. Recommend 24/7 supervision.       Recommendations for follow up therapy are one component of a multi-disciplinary discharge planning process, led by the attending physician.  Recommendations may be updated based on patient status, additional functional criteria and insurance authorization.  Follow Up Recommendations No PT follow up      Assistance Recommended at Discharge Frequent or constant Supervision/Assistance  Patient can return home with the following  Assist for transportation;Assistance with cooking/housework;Help with stairs or ramp for entrance    Equipment Recommendations None recommended by PT  Recommendations for Other Services       Functional Status Assessment Patient has had a recent decline in their functional status and demonstrates the ability to make significant improvements in function in a reasonable and  predictable amount of time.     Precautions / Restrictions Precautions Precautions: Fall Restrictions Weight Bearing Restrictions: No      Mobility  Bed Mobility Overal bed mobility: Modified Independent                  Transfers Overall transfer level: Modified independent                      Ambulation/Gait Ambulation/Gait assistance: Min assist Gait Distance (Feet): 350 Feet Assistive device: IV Pole (intermittent use of hallway handrail for 2nd point of support) Gait Pattern/deviations: Step-through pattern, Decreased stride length       General Gait Details: Intermittent assist to steady but mostly Min guard with UE support. Tolerated distance well.  Stairs            Wheelchair Mobility    Modified Rankin (Stroke Patients Only)       Balance Overall balance assessment: Mild deficits observed, not formally tested                                           Pertinent Vitals/Pain Pain Assessment Pain Assessment: No/denies pain    Home Living Family/patient expects to be discharged to:: Private residence Living Arrangements: Alone Available Help at Discharge: Family;Available PRN/intermittently Type of Home: House Home Access: Stairs to enter   [employer] of Steps: 1 w/ rail , 3 steps inside w/ rail   Home Layout: One level Home Equipment: Standard Walker      Prior Function Prior Level of Function : Independent/Modified Independent  Hand Dominance   Dominant Hand: Right    Extremity/Trunk Assessment   Upper Extremity Assessment Upper Extremity Assessment: Defer to OT evaluation    Lower Extremity Assessment Lower Extremity Assessment: Generalized weakness    Cervical / Trunk Assessment Cervical / Trunk Assessment: Normal  Communication   Communication: No difficulties  Cognition Arousal/Alertness: Awake/alert Behavior During Therapy: WFL for tasks  assessed/performed Overall Cognitive Status: History of cognitive impairments - at baseline                                 General Comments: Able to follow commands.        General Comments      Exercises     Assessment/Plan    PT Assessment Patient needs continued PT services  PT Problem List Decreased strength;Decreased mobility;Decreased balance       PT Treatment Interventions DME instruction;Gait training;Therapeutic exercise;Balance training;Functional mobility training;Therapeutic activities;Patient/family education    PT Goals (Current goals can be found in the Care Plan section)  Acute Rehab PT Goals Patient Stated Goal: per family-home PT Goal Formulation: With family Time For Goal Achievement: 05/09/22 Potential to Achieve Goals: Good    Frequency Min 3X/week     Co-evaluation               AM-PAC PT "6 Clicks" Mobility  Outcome Measure Help needed turning from your back to your side while in a flat bed without using bedrails?: None Help needed moving from lying on your back to sitting on the side of a flat bed without using bedrails?: None Help needed moving to and from a bed to a chair (including a wheelchair)?: None Help needed standing up from a chair using your arms (e.g., wheelchair or bedside chair)?: A Little Help needed to walk in hospital room?: A Little Help needed climbing 3-5 steps with a railing? : A Little 6 Click Score: 21    End of Session Equipment Utilized During Treatment: Gait belt Activity Tolerance: Patient tolerated treatment well Patient left: in bed;with call bell/phone within reach;with family/visitor present   PT Visit Diagnosis: Unsteadiness on feet (R26.81);Muscle weakness (generalized) (M62.81)    Time: 5945-8592 PT Time Calculation (min) (ACUTE ONLY): 13 min   Charges:   PT Evaluation $PT Eval Low Complexity: [place], PT Acute Rehabilitation  Office:  [phone]

## 2022-04-25 NOTE — Evaluation (Signed)
Occupational Therapy Evaluation Patient Details Name: Dwayne [name] MRN: [id number] DOB: Jul 19, 1934 Today's Date: 04/25/2022   History of Present Illness Dwayne [name] is a 88 y.o. male with medical history significant of abnormal EKG, RBBB, acute tear lateral meniscus, aortic regurgitation, aortic stenosis,  peripheral vascular disease, BPH, coronary atherosclerosis, chronic leukopenia, hyperlipidemia, hypertension, mild aortic valve regurgitation, nephrolithiasis presented to hospital with progressive shortness of breath and confusion. Patietn admitted with Acute encephalopathy .and abnormal LFTs. 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 He is pleasant and can follow commands but is not oriented to time. There are concerns for discharge due to his cognition and him not having 24/7 assistance. Family is trying to work that out. From a functional standpoint - patient has no OT needs.       Recommendations for follow up therapy are one component of a multi-disciplinary discharge planning process, led by the attending physician.  Recommendations may be updated based on patient status, additional functional criteria and insurance authorization.   Follow Up Recommendations  No OT follow up     Assistance Recommended at Discharge Frequent or constant Supervision/Assistance  Patient can return home with the following Assistance with cooking/housework;Direct supervision/assist for financial management;Direct supervision/assist for medications management    Functional Status Assessment  Patient has had a recent decline in their functional status and demonstrates the ability to make significant improvements in function in a reasonable and predictable amount of time.  Equipment  Recommendations  None recommended by OT    Recommendations for Other Services       Precautions / Restrictions Precautions Precautions: None      Mobility Bed Mobility               General bed mobility comments: up in chair    Transfers Overall transfer level: Needs assistance                 General transfer comment: No physical assistance to ambulate in room and perform toilet transfer. He is using intermittent hand holds on furniture and walls.      Balance Overall balance assessment: Mild deficits observed, not formally tested                                         ADL either performed or assessed with clinical judgement   ADL Overall ADL's : At baseline                                             Vision   Vision Assessment?: No apparent visual deficits     Perception     Praxis      Pertinent Vitals/Pain Pain Assessment Pain Assessment: No/denies pain     Hand Dominance Right   Extremity/Trunk Assessment Upper Extremity Assessment Upper Extremity Assessment: Overall WFL for tasks assessed   Lower Extremity Assessment Lower Extremity Assessment: Defer to PT evaluation   Cervical / Trunk Assessment Cervical / Trunk Assessment: Normal   Communication Communication Communication: No difficulties   Cognition Arousal/Alertness: Awake/alert Behavior During Therapy: [facility]  for tasks assessed/performed Overall Cognitive Status: History of cognitive impairments - at baseline                                 General Comments: Not alert to month or year. Knows the President. Able to follow commands.     General Comments       Exercises     Shoulder Instructions      Home Living Family/patient expects to be discharged to:: Private residence Living Arrangements: Alone Available Help at Discharge: Family;Available PRN/intermittently Type of Home: House Home Access: Stairs to  enter [employer] of Steps: 1 w/ rail , 3 steps inside w/ rail   Home Layout: One level     Bathroom Shower/Tub: Occupational psychologist: Handicapped height     Home Equipment: Chartered certified accountant          Prior Functioning/Environment Prior Level of Function : Independent/Modified Independent                        OT Problem List: Decreased cognition      OT Treatment/Interventions:      OT Goals(Current goals can be found in the care plan section) Acute Rehab OT Goals OT Goal Formulation: All assessment and education complete, DC therapy  OT Frequency:      Co-evaluation              AM-PAC OT "6 Clicks" Daily Activity     Outcome Measure Help from another person eating meals?: None Help from another person taking care of personal grooming?: None Help from another person toileting, which includes using toliet, bedpan, or urinal?: None Help from another person bathing (including washing, rinsing, drying)?: None Help from another person to put on and taking off regular upper body clothing?: None Help from another person to put on and taking off regular lower body clothing?: None 6 Click Score: 24   End of Session Nurse Communication: Mobility status  Activity Tolerance: Patient tolerated treatment well Patient left: in chair;with call bell/phone within reach  OT Visit Diagnosis: Other symptoms and signs involving cognitive function                Time: 1346-1359 OT Time Calculation (min): 13 min Charges:  OT General Charges $OT Visit: 1 Visit OT Evaluation $OT Eval Low Complexity: 1 Low  Gustavo Lah, OTR/L [place]  Office [phone]   Lenward Chancellor 04/25/2022, 3:28 PM

## 2022-04-25 NOTE — Consult Note (Signed)
Neurology Consultation Reason for Consult: Stroke Referring Physician: Pokhrel, L  CC: Altered mental status  History is obtained from: Patient's family  HPI: Dwayne [name] is a 88 y.o. male with a history of hypertension, hyperlipidemia, hypercholesterolemia, history of smoking who presents with altered mental status that started on Saturday.  His son noticed him to be confused, but initially attributed to some sleeping medications.  Subsequently on Sunday he found him to be lethargic and not knowing what day it was and therefore brought him into the emergency department for further evaluation.  On our evaluation in the ED, he was also having shortness of breath, was noted to be in CHF exacerbation with an elevation in LFTs, creatinine with uremia.  As part of workup for his altered mental status an MRI was performed which shows small right frontal infarct.   LKW: unclear tnk given?: no, unclear time of onset   Past Medical History:  Diagnosis Date   Abnormal EKG    Acute tear lateral meniscus    Aortic regurgitation    Aortic stenosis    Atherosclerotic peripheral vascular disease (HCC)    Benign prostatic hyperplasia    BPH (benign prostatic hyperplasia)    Carotid atherosclerosis    Chronic leukopenia    Heart murmur    History of smoking    Hypercholesterolemia    Hyperlipidemia    Hypertension    Mild aortic valve regurgitation    Nephrolithiasis    RBBB      Family History  Problem Relation Age of Onset   Asthma Mother    Other Father        BRAIN TURMOR   Cancer - Other Father        MALIGNANT NEOPLASM     Social History:  reports that he quit smoking about 51 years ago. His smoking use included cigarettes. He has a 10.00 pack-year smoking history. His smokeless tobacco use includes chew. He reports that he does not drink alcohol and does not use drugs.   Exam: Current vital signs: BP (!) 108/59 (BP Location: Left Arm)   Pulse 74   Temp 98.2 F (36.8  C)   Resp 18   Ht '5\' 11"'$  (1.803 m)   Wt 64.6 kg   SpO2 99%   BMI 19.86 kg/m  Vital signs in last 24 hours: Temp:  [97.7 F (36.5 C)-98.2 F (36.8 C)] 98.2 F (36.8 C) (01/17 0649) Pulse Rate:  [74-98] 74 (01/17 0649) Resp:  [16-18] 18 (01/17 0649) BP: (91-112)/(47-75) 108/59 (01/17 0649) SpO2:  [95 %-99 %] 99 % (01/17 0649) Weight:  [64.6 kg] 64.6 kg (01/16 1200)   Physical Exam  Appears well-developed and well-nourished.   Neuro: Mental Status: Patient is awake, alert, oriented to person, gives places [facility], is unable to give the month or the year answers "19...Marland KitchenMarland Kitchen" when asked the year No signs of aphasia or neglect Cranial Nerves: II: Visual Fields are full. Pupils are equal, round, and reactive to light.   III,IV, VI: EOMI without ptosis or diploplia.  V: Facial sensation is symmetric to temperature VII: Facial movement is symmetric.  VIII: hearing is intact to voice X: Uvula elevates symmetrically XI: Shoulder shrug is symmetric. XII: tongue is midline without atrophy or fasciculations.  Motor: Tone is normal. Bulk is normal. 5/5 strength was present in bilateral legs and right arm, he has mild weakness to confrontation without drift of the left arm which he states has been present for multiple years  Sensory: Sensation is symmetric to light touch and temperature in the arms and legs. Cerebellar: No definite ataxia     I have reviewed labs in epic and the results pertinent to this consultation are: BUN 73 -> 57 Creatinine 2.15 -> 1.84 Elevated LFTs Ammonia was 40 on admission  I have reviewed the images obtained: RI brain-small right frontal infarct  Impression: 88 year old male with small right frontal infarct which could be secondary to small vessel disease, though cardiac embolus is not excluded.  Given his new onset atrial fibrillation, if he is started on anticoagulation this could be done in 3 to 4 days from a stroke standpoint given the  small size and location.  He should be on antiplatelet therapy and is on aspirin and Plavix, but changed anticoagulation these can be stopped.  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  If his other organ systems were to improve, then conceivably he could have improvement into his mental status as well.  Recommendations: 1) continue aspirin and Plavix, from a stroke standpoint would only need dual antiplatelet therapy for 3 weeks followed by monotherapy, but no objection to continuation if he has another indication 2) if aggressive care is desired, can start anticoagulation in 3 to 4 days. 3) he has not tolerated statins in the past and therefore we will avoid statin therapy, again if aggressive care is pursued then he will need to discuss a PCSK9 inhibitor with his PCP for goal LDL < 70. 4) PT, OT, ST 5) Neurology will be available on an as needed basis.    Roland Rack, MD [facility] [phone]  If 7pm- 7am, please page neurology on call as listed in [place].

## 2022-04-25 NOTE — Progress Notes (Addendum)
PROGRESS NOTE    Dwayne [name]  NUU:[id number] DOB: 1934-07-23 DOA: 04/22/2022 PCP: Deland Pretty, MD    Brief Narrative:  RONNEL [name] is a 88 y.o. male with medical history significant of abnormal EKG, RBBB, acute tear lateral meniscus, aortic regurgitation, aortic stenosis,  peripheral vascular disease, BPH, coronary atherosclerosis, chronic leukopenia, hyperlipidemia, hypertension, mild aortic valve regurgitation, nephrolithiasis presented to hospital with progressive shortness of breath and confusion.  Shortness of breath ongoing for 2 weeks or so.  In the ED patient was tachycardic.  Labs showed sodium of 132 creatinine elevated at 2.2, creatinine 5 years back was 1.3.  Chest x-ray showed chronic interstitial disease and bibasilar scaring.  CT head without any acute findings.  Right upper quadrant ultrasound shows cirrhosis of liver with ascites.  CT scan of the abdomen pelvis showed cirrhosis bilateral pleural effusion.  Patient was then considered for admission to the hospital for further evaluation and treatment.  Assessment and plan.  Acute encephalopathy . Elevated LFTs.  Likely metabolic /hepatic encephalopathy.  Ultrasound and CT scans with cirrhosis of liver.  Continue lactulose.  MRI of the abdomen showed no biliary dilatation or choledocholithiasis but cirrhotic liver with bilateral pleural effusion and trace ascites.  GI was consulted.  AST ALT elevated at 310, 316 T. bili on presentation..  Ammonia mildly elevated at 40.  COVID and influenza was negative.  Acute hepatitis panel negative.  Cultures negative in 1 day.  Will continue with the lactulose.  Small acute infarct in the right frontal white matter.  On the background of advanced to background chronic small vessel ischemic changes.  Patient was on aspirin as outpatient.  Spoke with neurology.  Recommendation is aspirin and Plavix for 21 days and an anticoagulation in 3 to 4 days if goals of care are to continue with  aggressive treatment..  Cirrhosis of liver.  Could be cardiac cirrhosis.  GI on board.  Ultrasound and CT shows cirrhotic changes with ascites.  MRI of the abdomen suggesting cirrhosis as well.  GI has ordered liver workup.  Continue lactulose.  Hyponatremia.  Likely secondary to heart failure cirrhosis of liver.    Elevated brain natriuretic peptide (BNP) level/elevated troponin likely acute systolic heart failure.. Cardiology on board and currently on Lasix 20 twice daily.  2D echocardiogram with reduced LV function of less than 20% with global hypokinesis with small pericardial effusion, severe aortic valve stenosis of 0.48 cm.       Hypertension with heart disease On Lasix IV twice daily.     Hypercholesterolemia Currently not on any treatment.     Microcytic anemia     Hyperkalemia Patient received Lokelma 10 g p.o. x 1.  Potassium today at 3.5.    Elevated creatinine level. Unsure whether  this is a chronic progressive kidney disease versus AKI.   on Lasix 20 twice daily.  Creatinine little better at 1.8 from 2.1.  Goals of care discussion.  Had a prolonged discussion with the patient's son and Cardiology at bedside. Poor prognosis at this time.  Due to multiorgan dysfunction including history of pulmonary fibrosis, cirrhosis of liver, renal failure with new stroke or new atrial fibrillation with significant cardiac dysfunction with severe aortic stenosis and reduced LV function less than 20% puts the patient at the very high risk of decompensation and mortality.  Patient is not a good candidate for invasive treatment.  Patient's son wishes to discuss about palliative care and hospice.  TOC has been consulted for hospice evaluation.  DVT prophylaxis: SCDs Start: 04/23/22 2126   Code Status:     Code Status: Full Code  Disposition: Will discuss hospice level of care/evaluation.  Status is: Inpatient Remains inpatient appropriate because: Multiple issues including severe  aortic stenosis, congestive heart failure, reduced LV function, stroke, cirrhosis of liver, needs goals of care/hospice evaluation.   Family Communication: Spoke with the patient's son at bedside.  Consultants:  Cardiology Neurology GI Palliative care  Procedures:  2D echocardiogram,  MRI of the brain,   Antimicrobials:  Rocephin IV  Anti-infectives (From admission, onward)    Start     Dose/Rate Route Frequency Ordered Stop   04/24/22 0800  cefTRIAXone (ROCEPHIN) 1 g in sodium chloride 0.9 % 100 mL IVPB        1 g 200 mL/hr over 30 Minutes Intravenous Every 24 hours 04/23/22 1723     04/23/22 0800  cefTRIAXone (ROCEPHIN) 2 g in sodium chloride 0.9 % 100 mL IVPB        2 g 200 mL/hr over 30 Minutes Intravenous  Once 04/23/22 0752 04/23/22 0903      Subjective: Today, patient was seen and examined at bedside.  Patient's son at bedside.  Patient still has some cough and shortness of breath.  Appears very weak and deconditioned.  Objective: Vitals:   04/24/22 2224 04/25/22 0146 04/25/22 0149 04/25/22 0649  BP: (!) 92/48 (!) 91/47 97/64 (!) 108/59  Pulse: 82 92 97 74  Resp: '16 18 18 18  '$ Temp: 98.2 F (36.8 C) 97.7 F (36.5 C) 97.7 F (36.5 C) 98.2 F (36.8 C)  TempSrc: Oral Oral Oral   SpO2: 95% 95% 95% 99%  Weight:      Height:        Intake/Output Summary (Last 24 hours) at 04/25/2022 1108 Last data filed at 04/25/2022 0408 Gross per 24 hour  Intake 100 ml  Output 1300 ml  Net -1200 ml    Filed Weights   04/23/22 1800 04/24/22 1200  Weight: 65.5 kg 64.6 kg    Physical Examination: Body mass index is 19.86 kg/m.   General:  Thinly built, not in obvious distress, appears weak frail and deconditioned. HENT: Mild icterus noted.  Oral mucosa is moist.  Chest-diminished breath sounds noted CVS: S1 &S2 heard.  Murmur noted, irregular rhythm. abdomen: Soft, nontender, nondistended.  Bowel sounds are heard.   Extremities: No cyanosis, clubbing or edema.   Peripheral pulses are palpable. Psych: Alert, awake and Communicative, slow to respond, CNS:  No cranial nerve deficits.  Generalized weakness noted  Skin: Warm and dry.  No rashes noted.  Data Reviewed:   CBC: Recent Labs  Lab 04/22/22 1354 04/23/22 0823 04/24/22 0501 04/25/22 0517  WBC 7.4 7.6 7.4 6.5  NEUTROABS 6.7  --   --   --   HGB 13.0 12.2* 11.4* 11.1*  HCT 37.5* 35.0* 33.4* 31.5*  MCV 81.5 79.2* 80.7 78.8*  PLT 129* 119* 106* 90*     Basic Metabolic Panel: Recent Labs  Lab 04/22/22 1354 04/23/22 0823 04/24/22 0501 04/25/22 0517  NA 132* 134* 134* 134*  K 4.9 5.4* 3.9 3.5  CL 100 101 104 102  CO2 21* 20* 20* 21*  GLUCOSE 133* 94 122* 83  BUN 76* 79* 73* 57*  CREATININE 2.25* 2.16* 2.15* 1.84*  CALCIUM 9.4 9.1 8.5* 8.2*     Liver Function Tests: Recent Labs  Lab 04/22/22 1354 04/23/22 0823 04/24/22 0501 04/25/22 0517  AST 310* 297* 196* 130*  ALT  316* 305* 236* 183*  ALKPHOS 157* 180* 145* 138*  BILITOT 2.4* 3.8* 2.3* 2.2*  PROT 6.5 5.8* 5.1* 4.9*  ALBUMIN 3.6 3.5 2.9* 2.7*      Radiology Studies: MR 3D Recon At Scanner  Result Date: 04/24/2022 CLINICAL DATA:  Elevated LFTs EXAM: MRI ABDOMEN WITHOUT CONTRAST  (INCLUDING MRCP) TECHNIQUE: Multiplanar multisequence MR imaging of the abdomen was performed. Heavily T2-weighted images of the biliary and pancreatic ducts were obtained, and three-dimensional MRCP images were rendered by post processing. COMPARISON:  Abdomen and pelvis CT dated April 22, 2022 FINDINGS: Lower chest: Bilateral pleural effusions. Hepatobiliary: Nodular liver contour. No focal liver abnormality. Gallbladder is unremarkable. No biliary ductal dilation or evidence of choledocholithiasis, although motion artifact somewhat limits evaluation. Pancreas: No mass, inflammatory changes, or other parenchymal abnormality identified. Spleen:  Within normal limits in size and appearance. Adrenals/Urinary Tract: Bilateral adrenal glands are  unremarkable. No hydronephrosis nephrolithiasis. Bilateral T2 hyperintense renal lesions, incompletely evaluated due to lack of IV contrast, but likely simple cysts, no specific follow-up imaging is recommended. Stomach/Bowel: Small hiatal hernia. Visualized portions of the small and large bowel are unremarkable. Vascular/Lymphatic: No pathologically enlarged lymph nodes identified. No abdominal aortic aneurysm demonstrated. Other:  Trace abdominal ascites. Musculoskeletal: No suspicious bone lesions identified. IMPRESSION: 1. No biliary ductal dilation. No evidence of choledocholithiasis, although motion artifact somewhat limits evaluation. 2. Cirrhotic liver morphology. 3. Bilateral pleural effusions and trace abdominal ascites. Electronically Signed   By: Yetta Glassman M.D.   On: 04/24/2022 12:17   MR ABDOMEN MRCP WO CONTRAST  Result Date: 04/24/2022 CLINICAL DATA:  Elevated LFTs EXAM: MRI ABDOMEN WITHOUT CONTRAST  (INCLUDING MRCP) TECHNIQUE: Multiplanar multisequence MR imaging of the abdomen was performed. Heavily T2-weighted images of the biliary and pancreatic ducts were obtained, and three-dimensional MRCP images were rendered by post processing. COMPARISON:  Abdomen and pelvis CT dated April 22, 2022 FINDINGS: Lower chest: Bilateral pleural effusions. Hepatobiliary: Nodular liver contour. No focal liver abnormality. Gallbladder is unremarkable. No biliary ductal dilation or evidence of choledocholithiasis, although motion artifact somewhat limits evaluation. Pancreas: No mass, inflammatory changes, or other parenchymal abnormality identified. Spleen:  Within normal limits in size and appearance. Adrenals/Urinary Tract: Bilateral adrenal glands are unremarkable. No hydronephrosis nephrolithiasis. Bilateral T2 hyperintense renal lesions, incompletely evaluated due to lack of IV contrast, but likely simple cysts, no specific follow-up imaging is recommended. Stomach/Bowel: Small hiatal hernia.  Visualized portions of the small and large bowel are unremarkable. Vascular/Lymphatic: No pathologically enlarged lymph nodes identified. No abdominal aortic aneurysm demonstrated. Other:  Trace abdominal ascites. Musculoskeletal: No suspicious bone lesions identified. IMPRESSION: 1. No biliary ductal dilation. No evidence of choledocholithiasis, although motion artifact somewhat limits evaluation. 2. Cirrhotic liver morphology. 3. Bilateral pleural effusions and trace abdominal ascites. Electronically Signed   By: Yetta Glassman M.D.   On: 04/24/2022 12:17   ECHOCARDIOGRAM COMPLETE  Result Date: 04/24/2022    ECHOCARDIOGRAM REPORT   Patient Name:   TALOR CHEEMA Date of Exam: 04/24/2022 Medical Rec #:  [id number]      Height:       71.0 in Accession #:    [id number]     Weight:       144.4 lb Date of Birth:  11-04-1934     BSA:          1.836 m Patient Age:    89 years       BP:           111/53 mmHg Patient Gender:  M              HR:           75 bpm. Exam Location:  Inpatient Procedure: 2D Echo, Cardiac Doppler, Color Doppler, 3D Echo and Strain Analysis Indications:     Elevated Troponin  History:         Patient has prior history of Echocardiogram examinations, most                  recent 10/05/2020. CHF, Carotid Disease, Aortic Valve Disease,                  Arrythmias:RBBB; Risk Factors:Hypertension and Dyslipidemia.  Sonographer:     Darlina Sicilian RDCS Referring Phys:  [id number] DAVID Audelia Hives ORTIZ Diagnosing Phys: Vernell Leep MD IMPRESSIONS  1. Left ventricular ejection fraction, by estimation, is <20%. The left ventricle has severely decreased function. The left ventricle demonstrates global hypokinesis. The left ventricular internal cavity size was mildly dilated. Left ventricular diastolic parameters are indeterminate. Elevated left atrial pressure. The average left ventricular global longitudinal strain is -7.2 %. The global longitudinal strain is abnormal.  2. Right ventricular systolic  function is mildly reduced. The right ventricular size is normal.  3. Left atrial size was mildly dilated.  4. Right atrial size was moderately dilated.  5. A small pericardial effusion is present. There is no evidence of cardiac tamponade.  6. The mitral valve is grossly normal. Moderate mitral valve regurgitation.  7. Tricuspid valve regurgitation is moderate.  8. The aortic valve is calcified. Aortic valve regurgitation is moderate. Severe aortic valve stenosis. Aortic valve area, by VTI measures 0.48 cm. Aortic valve mean gradient measures 41.0 mmHg. Aortic valve Vmax measures 4.28 m/s.  9. The inferior vena cava is dilated in size with <50% respiratory variability, suggesting right atrial pressure of 15 mmHg. Comparison(s): Previous study on 10/05/2020 showed EF 55-60%, normal RV function, mod LA dilatation, trivial MR, no TR, mod-severe AS (Vmax 3.9 m/sec, DI 0.26). FINDINGS  Left Ventricle: Left ventricular ejection fraction, by estimation, is <20%. The left ventricle has severely decreased function. The left ventricle demonstrates global hypokinesis. The average left ventricular global longitudinal strain is -7.2 %. The global longitudinal strain is abnormal. The left ventricular internal cavity size was mildly dilated. There is no left ventricular hypertrophy. Left ventricular diastolic parameters are indeterminate. Elevated left atrial pressure. Right Ventricle: The right ventricular size is normal. No increase in right ventricular wall thickness. Right ventricular systolic function is mildly reduced. Left Atrium: Left atrial size was mildly dilated. Right Atrium: Right atrial size was moderately dilated. Pericardium: A small pericardial effusion is present. There is no evidence of cardiac tamponade. Mitral Valve: The mitral valve is grossly normal. Moderate mitral valve regurgitation. Tricuspid Valve: The tricuspid valve is grossly normal. Tricuspid valve regurgitation is moderate. Aortic Valve: The  aortic valve is calcified. Aortic valve regurgitation is moderate. Aortic regurgitation PHT measures 332 msec. Severe aortic stenosis is present. Aortic valve mean gradient measures 41.0 mmHg. Aortic valve peak gradient measures 73.1 mmHg. Aortic valve area, by VTI measures 0.48 cm. Pulmonic Valve: The pulmonic valve was normal in structure. Pulmonic valve regurgitation is not visualized. No evidence of pulmonic stenosis. Aorta: The aortic root is normal in size and structure. Venous: The inferior vena cava is dilated in size with less than 50% respiratory variability, suggesting right atrial pressure of 15 mmHg. IAS/Shunts: The interatrial septum was not assessed.  LEFT VENTRICLE PLAX 2D LVIDd:  5.50 cm      Diastology LVIDs:         5.20 cm      LV e' medial:    3.94 cm/s LV PW:         1.00 cm      LV E/e' medial:  29.2 LV IVS:        1.10 cm      LV e' lateral:   5.10 cm/s LVOT diam:     1.90 cm      LV E/e' lateral: 22.5 LV SV:         41 LV SV Index:   23           2D Longitudinal Strain LVOT Area:     2.84 cm     2D Strain GLS (A2C):   -6.9 %                             2D Strain GLS (A3C):   -5.7 %                             2D Strain GLS (A4C):   -9.0 % LV Volumes (MOD)            2D Strain GLS Avg:     -7.2 % LV vol d, MOD A2C: 200.0 ml LV vol d, MOD A4C: 189.0 ml LV vol s, MOD A2C: 180.0 ml LV vol s, MOD A4C: 135.0 ml LV SV MOD A2C:     20.0 ml LV SV MOD A4C:     189.0 ml LV SV MOD BP:      38.9 ml RIGHT VENTRICLE RV Basal diam:  4.70 cm RV Mid diam:    2.60 cm RV S prime:     7.50 cm/s TAPSE (M-mode): 1.2 cm LEFT ATRIUM             Index        RIGHT ATRIUM           Index LA diam:        4.30 cm 2.34 cm/m   RA Area:     21.50 cm LA Vol (A2C):   74.6 ml 40.63 ml/m  RA Volume:   75.20 ml  40.96 ml/m LA Vol (A4C):   67.4 ml 36.71 ml/m LA Biplane Vol: 71.9 ml 39.16 ml/m  AORTIC VALVE AV Area (Vmax):    0.55 cm AV Area (Vmean):   0.52 cm AV Area (VTI):     0.48 cm AV Vmax:           427.50  cm/s AV Vmean:          298.000 cm/s AV VTI:            0.863 m AV Peak Grad:      73.1 mmHg AV Mean Grad:      41.0 mmHg LVOT Vmax:         83.30 cm/s LVOT Vmean:        54.200 cm/s LVOT VTI:          0.146 m LVOT/AV VTI ratio: 0.17 AI PHT:            332 msec  AORTA Ao Root diam: 3.00 cm Ao Asc diam:  3.10 cm MITRAL VALVE                TRICUSPID VALVE MV Area (  PHT): 6.04 cm     TR Peak grad:   37.2 mmHg MV Decel Time: 126 msec     TR Vmax:        305.00 cm/s MV E velocity: 115.00 cm/s                             SHUNTS                             Systemic VTI:  0.15 m                             Systemic Diam: 1.90 cm Vernell Leep MD Electronically signed by Vernell Leep MD Signature Date/Time: 04/24/2022/11:42:29 AM    Final    MR BRAIN WO CONTRAST  Result Date: 04/24/2022 CLINICAL DATA:  Fatigue, congestion, altered mental status. EXAM: MRI HEAD WITHOUT CONTRAST TECHNIQUE: Multiplanar, multiecho pulse sequences of the brain and surrounding structures were obtained without intravenous contrast. COMPARISON:  CT head 2 days prior. FINDINGS: Brain: There is a small linear focus of diffusion restriction in the right frontal subcortical white matter (5-36) consistent with a small acute infarct. There is no other evidence of acute infarct. There is no acute intracranial hemorrhage or extra-axial fluid collection. There is background parenchymal volume loss with prominence of the ventricular system and extra-axial CSF spaces. There is confluent FLAIR signal abnormality throughout the supratentorial white matter, nonspecific but likely reflecting sequela of advanced chronic small-vessel ischemic change. There is a small remote lacunar infarct in the left thalamic capsular region. The pituitary and suprasellar region are normal. There is no mass lesion. There is no mass effect or midline shift. Vascular: Normal flow voids. Skull and upper cervical spine: Normal marrow signal. Sinuses/Orbits: The paranasal  sinuses are clear. The globes and orbits are unremarkable. Other: There are left larger than right mastoid effusions. The imaged nasopharynx is unremarkable. IMPRESSION: 1. Small acute infarct in the right frontal white matter. 2. Advanced background chronic small-vessel ischemic change and remote infarct in the left thalamic capsular region. 3. Left larger than right mastoid effusions. Electronically Signed   By: Valetta Mole M.D.   On: 04/24/2022 11:13      LOS: 2 days     Flora Lipps, MD [facility] Available via Epic secure chat 7am-7pm After these hours, please refer to coverage provider listed on [email or web address] 04/25/2022, 11:08 AM

## 2022-04-25 NOTE — TOC Progression Note (Signed)
Transition of Care [facility]) - Progression Note    Patient Details  Name: MAGDIEL BARTLES MRN: [id number] Date of Birth: 1935-01-19  [facility]) CM/SW Contact  Purcell Mouton, RN Phone Number: 04/25/2022, 9:25 AM  Clinical Narrative:    Spoke with pt's son Dallin concerning Home with Hospice. [facility] was selected. Referral given to in house rep.    Expected Discharge Plan: Home w Hospice Care Barriers to Discharge: No Barriers Identified  Expected Discharge Plan and Services     Post Acute Care Choice: Hospice Living arrangements for the past 2 months: Single Family Home                                       Social Determinants of Health (SDOH) Interventions SDOH Screenings   Food Insecurity: No Food Insecurity (04/23/2022)  Housing: Low Risk  (04/23/2022)  Transportation Needs: No Transportation Needs (04/23/2022)  Utilities: Not At Risk (04/23/2022)  Tobacco Use: High Risk (04/22/2022)    Readmission Risk Interventions     No data to display

## 2022-04-25 NOTE — Progress Notes (Signed)
Heart Failure Navigator Progress Note  Assessed for Heart & Vascular TOC clinic readiness.  Patient does not meet criteria due to [facility] cardiology.   Navigator will sign off at this time.    Earnestine Leys, BSN, Clinical cytogeneticist Only

## 2022-04-25 NOTE — TOC Progression Note (Signed)
Transition of Care [facility]) - Progression Note    Patient Details  Name: NEFTALI ABAIR MRN: [id number] Date of Birth: 11/03/1934  Transition of Care [facility]) CM/SW Contact  Purcell Mouton, RN Phone Number: 04/25/2022, 11:27 AM  Clinical Narrative:     Spoke with pt's daughter in law with son Vladislav Axelson at bedside.  Explained SNF and personal care sitters to daughter.   Expected Discharge Plan: Home w Hospice Care Barriers to Discharge: No Barriers Identified  Expected Discharge Plan and Services     Post Acute Care Choice: Hospice Living arrangements for the past 2 months: Single Family Home                                       Social Determinants of Health (SDOH) Interventions SDOH Screenings   Food Insecurity: No Food Insecurity (04/23/2022)  Housing: Low Risk  (04/23/2022)  Transportation Needs: No Transportation Needs (04/23/2022)  Utilities: Not At Risk (04/23/2022)  Tobacco Use: High Risk (04/22/2022)    Readmission Risk Interventions     No data to display

## 2022-04-25 NOTE — Progress Notes (Signed)
I met with patient, son, and primary team Dr. Marianne Sofia this morning. In view of his multiorgan failure, I agree with considering palliative/hospice route.. If there were to be any clinical change in multiple comorbidities, I will be happy to see the patient again.   Nigel Mormon, MD Pager: [phone] Office: [phone]

## 2022-04-25 NOTE — Progress Notes (Signed)
[facility] Progress Note  Dwayne [name] 88 y.o. 07/23/1934  CC:   Cirrhosis, encephalopathy  Subjective: Patient seen and examined at bedside.  His son is at bedside.  He is more alert today.  Results from yesterday's investigation noted.  ROS : Afebrile, negative for chest pain   Objective: Vital signs in last 24 hours: Vitals:   04/25/22 0149 04/25/22 0649  BP: 97/64 (!) 108/59  Pulse: 97 74  Resp: 18 18  Temp: 97.7 F (36.5 C) 98.2 F (36.8 C)  SpO2: 95% 99%    Physical Exam:  Elderly patient, resting comfortably.  Not in acute distress.  Minimally distended abdomen but otherwise abdominal exam benign.  He is more alert today compared to yesterday.  Lab Results: Recent Labs    04/24/22 0501 04/25/22 0517  NA 134* 134*  K 3.9 3.5  CL 104 102  CO2 20* 21*  GLUCOSE 122* 83  BUN 73* 57*  CREATININE 2.15* 1.84*  CALCIUM 8.5* 8.2*   Recent Labs    04/24/22 0501 04/25/22 0517  AST 196* 130*  ALT 236* 183*  ALKPHOS 145* 138*  BILITOT 2.3* 2.2*  PROT 5.1* 4.9*  ALBUMIN 2.9* 2.7*   Recent Labs    04/22/22 1354 04/23/22 0823 04/24/22 0501 04/25/22 0517  WBC 7.4   < > 7.4 6.5  NEUTROABS 6.7  --   --   --   HGB 13.0   < > 11.4* 11.1*  HCT 37.5*   < > 33.4* 31.5*  MCV 81.5   < > 80.7 78.8*  PLT 129*   < > 106* 90*   < > = values in this interval not displayed.   Recent Labs    04/24/22 1220  LABPROT 22.0*  INR 1.9*      Assessment/Plan: -Abnormal LFTs most likely from newly diagnosed cirrhosis.  No significant alcohol use according to patient's son.  Hepatitis panel negative.  Normal ferritin.  Elevated ceruloplasmin. MELD 24 -Encephalopathy.  Could be hepatic encephalopathy. -Acute kidney injury.  Improving.?  Hepatorenal syndrome. -Mild ascites.  Do not think there is enough ascites for paracentesis. -Shortness of breath.  Factorial from interstitial lung disease, questionable recent pneumonia and possible CHF. -Acute stroke  based on recent MRI.   Recommendations ------------------------ -Patient with significantly elevated MELD score at 24 which could be driven by his abnormal kidney function and elevated INR.  Echocardiogram yesterday showed severe aortic stenosis as well as significant decrease in EF to 20%.  Looks like they are pursuing palliative care at this time. -Start albumin infusion to help out with kidney function. -Recommend 2 g sodium diet -GI will follow  -Poor long-term prognosis given multiple medical comorbidities including cirrhosis, cardiomyopathy with EF of 20%, recent stroke, interstitial lung disease as well as severe aortic stenosis.  This was discussed with patient's son.   Otis Brace MD, [place] 04/25/2022, 9:28 AM  Contact #  [phone]

## 2022-04-25 NOTE — Progress Notes (Signed)
[id number]) Hospital Liaison Note   Referral received for hospice services at home from [place], [place].    Spoke with son and daughter- in-law at bedside to provide information on what hospice services we provide. Family is trying to decide if they would like to go home with hospice or a LTC with hospice.    Will continue to follow for discharge disposition.    TOC updated.   Please call with any questions/concerns.    Thanks,    Zigmund Gottron, RN  [facility] Liaison  [phone]

## 2022-04-25 NOTE — Progress Notes (Signed)
Mobility Specialist - Progress Note   04/25/22 1154  Mobility  Activity Ambulated with assistance in hallway  Level of Assistance Contact guard assist, steadying assist  Assistive Device Other (Comment) (HHA, Hallway Rails)  Distance Ambulated (ft) 120 ft  Range of Motion/Exercises Active  Activity Response Tolerated well  Mobility Referral Yes  $Mobility charge 1 Mobility   Pt was found in bed and agreeable to ambulate. Pt was a bit unsteady during ambulation and stated not using a RW. Pt required HHA for ambulation to be a bit more steady and during session was not oriented to place asking "where am I?". At EOS returned to recliner chair with necessities in reach and family in room. RN notified of session.  Ferd Hibbs Mobility Specialist

## 2022-04-26 DIAGNOSIS — G9340 Encephalopathy, unspecified: Secondary | ICD-10-CM | POA: Diagnosis not present

## 2022-04-26 DIAGNOSIS — R7989 Other specified abnormal findings of blood chemistry: Secondary | ICD-10-CM | POA: Diagnosis not present

## 2022-04-26 DIAGNOSIS — D509 Iron deficiency anemia, unspecified: Secondary | ICD-10-CM | POA: Diagnosis not present

## 2022-04-26 DIAGNOSIS — I5020 Unspecified systolic (congestive) heart failure: Secondary | ICD-10-CM | POA: Diagnosis not present

## 2022-04-26 LAB — COMPREHENSIVE METABOLIC PANEL
ALT: 128 U/L — ABNORMAL HIGH (ref 0–44)
AST: 88 U/L — ABNORMAL HIGH (ref 15–41)
Albumin: 4 g/dL (ref 3.5–5.0)
Alkaline Phosphatase: 133 U/L — ABNORMAL HIGH (ref 38–126)
Anion gap: 11 (ref 5–15)
BUN: 50 mg/dL — ABNORMAL HIGH (ref 8–23)
CO2: 21 mmol/L — ABNORMAL LOW (ref 22–32)
Calcium: 8.7 mg/dL — ABNORMAL LOW (ref 8.9–10.3)
Chloride: 101 mmol/L (ref 98–111)
Creatinine, Ser: 1.7 mg/dL — ABNORMAL HIGH (ref 0.61–1.24)
GFR, Estimated: 39 mL/min — ABNORMAL LOW (ref >60)
Glucose, Bld: 98 mg/dL (ref 70–99)
Potassium: 3.4 mmol/L — ABNORMAL LOW (ref 3.5–5.1)
Sodium: 133 mmol/L — ABNORMAL LOW (ref 135–145)
Total Bilirubin: 2.7 mg/dL — ABNORMAL HIGH (ref 0.3–1.2)
Total Protein: 6 g/dL — ABNORMAL LOW (ref 6.5–8.1)

## 2022-04-26 LAB — CBC
HCT: 31.2 % — ABNORMAL LOW (ref 39.0–52.0)
Hemoglobin: 10.8 g/dL — ABNORMAL LOW (ref 13.0–17.0)
MCH: 27.6 pg (ref 26.0–34.0)
MCHC: 34.6 g/dL (ref 30.0–36.0)
MCV: 79.8 fL — ABNORMAL LOW (ref 80.0–100.0)
Platelets: 82 10*3/uL — ABNORMAL LOW (ref 150–400)
RBC: 3.91 MIL/uL — ABNORMAL LOW (ref 4.22–5.81)
RDW: 14.5 % (ref 11.5–15.5)
WBC: 6.1 10*3/uL (ref 4.0–10.5)
nRBC: 0 % (ref 0.0–0.2)

## 2022-04-26 LAB — ALPHA-1-ANTITRYPSIN PHENOTYP: A-1 Antitrypsin, Ser: 110 mg/dL (ref 101–187)

## 2022-04-26 LAB — MAGNESIUM: Magnesium: 2.3 mg/dL (ref 1.7–2.4)

## 2022-04-26 MED ORDER — POTASSIUM CHLORIDE CRYS ER 20 MEQ PO TBCR
40.0000 meq | EXTENDED_RELEASE_TABLET | Freq: Once | ORAL | Status: AC
  Administered 2022-04-26: 40 meq via ORAL
  Filled 2022-04-26: qty 2

## 2022-04-26 MED ORDER — FUROSEMIDE 20 MG PO TABS: 20.0000 mg | ORAL_TABLET | Freq: Every morning | ORAL | 0 refills | Status: AC

## 2022-04-26 MED ORDER — LACTULOSE 10 GM/15ML PO SOLN: 20.0000 g | Freq: Two times a day (BID) | ORAL | 0 refills | Status: AC | PRN

## 2022-04-26 MED ORDER — ALBUMIN HUMAN 25 % IV SOLN
75.0000 g | Freq: Once | INTRAVENOUS | Status: AC
  Administered 2022-04-26: 75 g via INTRAVENOUS
  Filled 2022-04-26: qty 300

## 2022-04-26 MED ORDER — CLOPIDOGREL BISULFATE 75 MG PO TABS: 75.0000 mg | ORAL_TABLET | Freq: Every day | ORAL | 0 refills | Status: AC

## 2022-04-26 MED ORDER — LORAZEPAM 0.5 MG PO TABS: 0.5000 mg | ORAL_TABLET | Freq: Three times a day (TID) | ORAL | 0 refills | Status: AC | PRN

## 2022-04-26 MED ORDER — ASPIRIN 81 MG PO TBEC: 81.0000 mg | DELAYED_RELEASE_TABLET | Freq: Every day | ORAL | 2 refills | Status: AC

## 2022-04-26 NOTE — Progress Notes (Addendum)
[facility] Progress Note  CANDACE RAMUS 88 y.o. 1934/12/16  CC:   Cirrhosis, encephalopathy  Subjective: Patient seen and examined at bedside.  His son is at bedside.  Sitting in a chair comfortably.  No acute issues overnight.  ROS : Afebrile, negative for chest pain   Objective: Vital signs in last 24 hours: Vitals:   04/25/22 2036 04/26/22 0239  BP: 110/69 113/67  Pulse: 100 95  Resp: 18   Temp: (!) 97.4 F (36.3 C)   SpO2: 98% 97%    Physical Exam:  Elderly patient, resting comfortably.  Not in acute distress.  Minimally distended abdomen but otherwise abdominal exam benign.  He is more alert today compared to yesterday.  Lab Results: Recent Labs    04/25/22 0517 04/26/22 0518  NA 134* 133*  K 3.5 3.4*  CL 102 101  CO2 21* 21*  GLUCOSE 83 98  BUN 57* 50*  CREATININE 1.84* 1.70*  CALCIUM 8.2* 8.7*  MG  --  2.3   Recent Labs    04/25/22 0517 04/26/22 0518  AST 130* 88*  ALT 183* 128*  ALKPHOS 138* 133*  BILITOT 2.2* 2.7*  PROT 4.9* 6.0*  ALBUMIN 2.7* 4.0   Recent Labs    04/25/22 0517 04/26/22 0518  WBC 6.5 6.1  HGB 11.1* 10.8*  HCT 31.5* 31.2*  MCV 78.8* 79.8*  PLT 90* 82*   Recent Labs    04/24/22 1220  LABPROT 22.0*  INR 1.9*      Assessment/Plan: -Abnormal LFTs most likely from newly diagnosed cirrhosis.  No significant alcohol use according to patient's son.  Hepatitis panel negative.  Normal ferritin.  Elevated ceruloplasmin. MELD 24 -Encephalopathy.  Could be hepatic encephalopathy. -Acute kidney injury.  Improving.?  Hepatorenal syndrome. -Mild ascites.  Do not think there is enough ascites for paracentesis. -Shortness of breath.  Factorial from interstitial lung disease, questionable recent pneumonia and possible CHF. -Acute stroke based on recent MRI.   Recommendations ------------------------ -Only minimal improvement in creatinine from albumin infusion yesterday.  Recommend another albumin infusion of 75 g  today.  -Hopefully discharge later today after IV albumin infusion or tomorrow with palliative care   -Patient with significantly elevated MELD score at 24 which could be driven by his abnormal kidney function and elevated INR.  Echocardiogram yesterday showed severe aortic stenosis as well as significant decrease in EF to 20%.  Looks like they are pursuing palliative care at this time.  -Recommend 2 g sodium diet   -Poor long-term prognosis given multiple medical comorbidities including cirrhosis, cardiomyopathy with EF of 20%, recent stroke, interstitial lung disease as well as severe aortic stenosis.  This was discussed with patient's son.   Otis Brace MD, [place] 04/26/2022, 11:25 AM  Contact #  [phone]

## 2022-04-26 NOTE — Care Management Important Message (Signed)
Important Message  Patient Details IM Letter given. Name: Dwayne [name] MRN: [id number] Date of Birth: 1934-08-29   Medicare Important Message Given:  Yes     Kerin Salen 04/26/2022, 12:59 PM

## 2022-04-26 NOTE — Progress Notes (Signed)
Mobility Specialist - Progress Note   04/26/22 1030  Mobility  Activity Ambulated with assistance in hallway  Level of Assistance Standby assist, set-up cues, supervision of patient - no hands on  Assistive Device Front wheel walker  Distance Ambulated (ft) 260 ft  Activity Response Tolerated well  Mobility Referral Yes  $Mobility charge 1 Mobility   Pt received in bed and agreeable to mobility. Pt took 1x standing rest break due to fatigue. Pt to recliner after session with all needs met & nurse in room.   [facility]

## 2022-04-26 NOTE — Discharge Summary (Signed)
Physician Discharge Summary  Dwayne [name] QIH:[id number] DOB: [birth year] DOA: 04/22/2022  PCP: Deland Pretty, MD  Admit date: 04/22/2022 Discharge date: 04/26/2022  Admitted From: Home  Discharge disposition: Home with hospice  Recommendations for Outpatient Follow-Up:   Follow up with hospice care provider as outpatient.  Discharge Diagnosis:   Principal Problem:   Abnormal LFTs (liver function tests) Active Problems:   Nonrheumatic aortic insufficiency with aortic stenosis   Hypertension with heart disease   Hypercholesterolemia   Mild atherosclerosis of both carotid arteries   Hypervolemia   Microcytic anemia   Hyperkalemia   Acute encephalopathy   Elevated brain natriuretic peptide (BNP) level   Elevated troponin   HFrEF (heart failure with reduced ejection fraction) (HCC)   Severe aortic stenosis   Discharge Condition: Improved.  Diet recommendation: Low sodium, heart healthy.    Wound care: None.  Code status: DNR   History of Present Illness:   Dwayne [name] is a 88 y.o. male with medical history significant of abnormal EKG, RBBB, acute tear lateral meniscus, aortic regurgitation, aortic stenosis,  peripheral vascular disease, BPH, coronary atherosclerosis, chronic leukopenia, hyperlipidemia, hypertension, mild aortic valve regurgitation, nephrolithiasis presented to hospital with progressive shortness of breath and confusion.  Shortness of breath ongoing for 2 weeks or so.  In the ED patient was tachycardic.  Labs showed sodium of 132 creatinine elevated at 2.2, creatinine 5 years back was 1.3.  Chest x-ray showed chronic interstitial disease and bibasilar scaring.  CT head without any acute findings.  Right upper quadrant ultrasound shows cirrhosis of liver with ascites.  CT scan of the abdomen pelvis showed cirrhosis bilateral pleural effusion.  Patient was then considered for admission to the hospital for further evaluation and treatment.   Hospital  Course:   Following conditions were addressed during hospitalization as listed below,  Acute encephalopathy . Elevated LFTs.  Likely metabolic /hepatic encephalopathy.  Ultrasound and CT scans with cirrhosis of liver.  Continue lactulose.  MRI of the abdomen showed no biliary dilatation or choledocholithiasis but cirrhotic liver with bilateral pleural effusion and trace ascites.  GI was consulted.  AST ALT elevated at 310, 316, T. bili on presentation.  Ammonia mildly elevated at 40.  COVID and influenza was negative.  Acute hepatitis panel was negative.  Blood cultures negative in 3 days.  Will continue lactulose for now.   Small acute infarct in the right frontal white matter.  On the background of advanced to background chronic small vessel ischemic changes.  Patient was on aspirin as outpatient.  Spoke with neurology.  Recommendation is aspirin and Plavix for 21 days and would recommend anticoagulation in 3 to 4 days if goals of care are to continue with aggressive treatment..  At this time goal is home with hospice so will continue aspirin and Plavix only..   Cirrhosis of liver.  Could be cardiac cirrhosis.  GI on board.  Ultrasound and CT shows cirrhotic changes with ascites.  MRI of the abdomen suggesting cirrhosis as well.  GI had ordered liver workup.  Continue lactulose.  Will receive 1 dose of albumin prior to discharge.  History of pulmonary fibrosis.  Had seen pulmonary in the past and had refused treatment for pulmonary fibrosis.   Hyponatremia.  Likely secondary to heart failure cirrhosis of liver.  Sodium today at 133.  Mild hypokalemia.  Will give p.o. potassium 40 mill equivalents today.     Elevated brain natriuretic peptide (BNP) level/elevated troponin likely acute systolic heart failure.Marland Kitchen  Cardiology was consulted and patient received Lasix 20 twice daily.  2D echocardiogram with reduced LV function of less than 20% with global hypokinesis with small pericardial effusion,  severe aortic valve stenosis of 0.48 cm.   Patient will continue oral Lasix on discharge.     Hypertension with heart disease Will be on oral Lasix on discharge.     Hypercholesterolemia Currently not on any treatment.     Microcytic anemia     Hyperkalemia Patient received Lokelma 10 g p.o. x 1 initially.  Has received potassium supplements subsequently.   Elevated creatinine level. Unsure whether  this is a chronic progressive kidney disease versus AKI.    Creatinine 1.7 prior to discharge.  Goals of care discussion.  Had a prolonged discussion with the patient's son and Cardiology at bedside. Poor prognosis due to multiorgan dysfunction including history of pulmonary fibrosis, cirrhosis of liver, renal failure with new stroke, new atrial fibrillation with significant systolic dysfunction with severe aortic stenosis and reduced LV function less than 20%.  All these comorbidities puts the patient at the very high risk of decompensation and mortality.  Patient is not a good candidate for invasive treatment.  Patient's son wished for palliative care and hospice at home.   Disposition.  At this time, patient is stable for disposition home with hospice care.  Medical Consultants:   Cardiology Neurology GI Palliative care  Procedures:    2D echocardiogram,  MRI of the brain, Subjective:   Today, patient was seen and examined at bedside.  Appears to be weak and frail.  Denies increasing pain, shortness of breath.  Patient's son at bedside.  Discharge Exam:   Vitals:   04/25/22 2036 04/26/22 0239  BP: 110/69 113/67  Pulse: 100 95  Resp: 18   Temp: (!) 97.4 F (36.3 C)   SpO2: 98% 97%   Vitals:   04/25/22 1425 04/25/22 2036 04/26/22 0239 04/26/22 0500  BP: 104/63 110/69 113/67   Pulse: 75 100 95   Resp: (!) 22 18    Temp: (!) 97.5 F (36.4 C) (!) 97.4 F (36.3 C)    TempSrc: Oral Oral    SpO2: 100% 98% 97%   Weight:    68.4 kg  Height:       General: Thinly  built, appears weak frail and deconditioned.  Weak phonation, HENT: Mild icterus noted.. Oral mucosa is moist.  Chest:   diminished breath sounds bilaterally  CVS: S1 &S2 heard.  Murmur noted. Abdomen: Soft, nontender, mildly distended abdomen, bowel sounds are heard.   Extremities: No cyanosis, clubbing or edema.  Peripheral pulses are palpable. Psych: Alert, awake and Communicative, slow to respond, CNS:  No cranial nerve deficits.  Generalized weakness noted Skin: Warm and dry.  No rashes noted.  The results of significant diagnostics from this hospitalization (including imaging, microbiology, ancillary and laboratory) are listed below for reference.     Diagnostic Studies:   CT ABDOMEN PELVIS WO CONTRAST  Result Date: 04/22/2022 CLINICAL DATA:  Acute abdominal pain. EXAM: CT ABDOMEN AND PELVIS WITHOUT CONTRAST TECHNIQUE: Multidetector CT imaging of the abdomen and pelvis was performed following the standard protocol without IV contrast. RADIATION DOSE REDUCTION: This exam was performed according to the departmental dose-optimization program which includes automated exposure control, adjustment of the mA and/or kV according to patient size and/or use of iterative reconstruction technique. COMPARISON:  None Available. FINDINGS: Lower chest: Moderate bilateral pleural effusions. Hepatobiliary: Hepatic cirrhosis is demonstrated. No mass visualized on this unenhanced exam.  Gallbladder is unremarkable. No evidence of biliary ductal dilatation. Pancreas: No mass or inflammatory process visualized on this unenhanced exam. Spleen:  Within normal limits in size. Adrenals/Urinary tract: Renal vascular calcification is noted, as well as a few probable tiny 1-2 mm bilateral renal calculi. No evidence of ureteral calculi or hydronephrosis. Unremarkable unopacified urinary bladder. Stomach/Bowel: No evidence of obstruction, inflammatory process, or abnormal fluid collections. Normal appendix visualized.  Diverticulosis is seen mainly involving the sigmoid colon, however there is no evidence of diverticulitis. Vascular/Lymphatic: No pathologically enlarged lymph nodes identified. No evidence of abdominal aortic aneurysm. Aortic atherosclerotic calcification incidentally noted. Reproductive:  Mildly enlarged prostate. Other:  Mild abdominal and pelvic ascites noted. Musculoskeletal: No suspicious bone lesions identified. Old T12 vertebral body compression deformity noted. IMPRESSION: Hepatic cirrhosis. Mild ascites. Moderate bilateral pleural effusions. Colonic diverticulosis, without radiographic evidence of diverticulitis. Mildly enlarged prostate. Probable tiny bilateral renal calculi. No evidence of ureteral calculi or hydronephrosis. Electronically Signed   By: Marlaine Hind M.D.   On: 04/22/2022 16:46   US Abdomen Limited RUQ (LIVER/GB)  Result Date: 04/22/2022 CLINICAL DATA:  212411 Elevated liver enzymes 212411, pneumonia, fatigue EXAM: ULTRASOUND ABDOMEN LIMITED RIGHT UPPER QUADRANT COMPARISON:  None Available. FINDINGS: Gallbladder: No gallstones or wall thickening visualized. No sonographic Murphy sign noted by sonographer. Common bile duct: Not visualized Liver: Diffusely heterogeneous liver parenchyma with suggestion of fine liver surface irregularity, cannot exclude cirrhosis. No liver masses. Portal vein is patent on color Doppler imaging with normal direction of blood flow towards the liver. Other: Small volume perihepatic ascites. Evidence of right pleural effusion. IMPRESSION: 1. Diffusely heterogeneous liver parenchyma with suggestion of fine liver surface irregularity, suspect cirrhosis. No liver masses. 2. Small volume perihepatic ascites. 3. Right pleural effusion. 4. Normal gallbladder with no cholelithiasis. 5. Common bile duct not visualized. If bilirubin is elevated, further evaluation with CT abdomen/pelvis with IV contrast may be considered. Electronically Signed   By: Ilona Sorrel M.D.    On: 04/22/2022 16:37   DG Chest 2 View  Result Date: 04/22/2022 CLINICAL DATA:  Cough and shortness of breath for 2 weeks. Chronic interstitial lung disease. EXAM: CHEST - 2 VIEW COMPARISON:  04/17/2022 FINDINGS: The heart size and mediastinal contours are within normal limits. Chronic interstitial lung disease remains stable in appearance. No evidence of acute or superimposed pulmonary opacity. Stable bibasilar scarring, without evidence of pleural effusion. IMPRESSION: Stable chronic interstitial lung disease and bibasilar scarring. No acute findings. Electronically Signed   By: Marlaine Hind M.D.   On: 04/22/2022 12:21   CT Head Wo Contrast  Result Date: 04/22/2022 CLINICAL DATA:  88 year old male with altered mental status. EXAM: CT HEAD WITHOUT CONTRAST TECHNIQUE: Contiguous axial images were obtained from the base of the skull through the vertex without intravenous contrast. RADIATION DOSE REDUCTION: This exam was performed according to the departmental dose-optimization program which includes automated exposure control, adjustment of the mA and/or kV according to patient size and/or use of iterative reconstruction technique. COMPARISON:  None Available. FINDINGS: Brain: No midline shift, ventriculomegaly, mass effect, evidence of mass lesion, intracranial hemorrhage or evidence of cortically based acute infarction. Confluent bilateral cerebral white matter hypodensity. Bilateral deep white matter capsule involvement. Deep gray nuclei seem relatively spared. Posterior fossa gray-white differentiation within normal limits. No cortical encephalomalacia is identified. Vascular: Extensive Calcified atherosclerosis at the skull base. No suspicious intracranial vascular hyperdensity. Skull: Mild skull base motion artifact. No acute osseous abnormality identified. Sinuses/Orbits: Visualized paranasal sinuses and mastoids are clear. Other:  Visualized orbits and scalp soft tissues are within normal limits.  IMPRESSION: 1. Advanced cerebral white matter disease, most commonly due to small vessel ischemia. 2.  No acute intracranial abnormality identified. Electronically Signed   By: Genevie Ann M.D.   On: 04/22/2022 12:16     Labs:   Basic Metabolic Panel: Recent Labs  Lab 04/22/22 1354 04/23/22 0823 04/24/22 0501 04/25/22 0517 04/26/22 0518  NA 132* 134* 134* 134* 133*  K 4.9 5.4* 3.9 3.5 3.4*  CL 100 101 104 102 101  CO2 21* 20* 20* 21* 21*  GLUCOSE 133* 94 122* 83 98  BUN 76* 79* 73* 57* 50*  CREATININE 2.25* 2.16* 2.15* 1.84* 1.70*  CALCIUM 9.4 9.1 8.5* 8.2* 8.7*  MG  --   --   --   --  2.3   GFR Estimated Creatinine Clearance: 30.7 mL/min (A) (by C-G formula based on SCr of 1.7 mg/dL (H)). Liver Function Tests: Recent Labs  Lab 04/22/22 1354 04/23/22 0823 04/24/22 0501 04/25/22 0517 04/26/22 0518  AST 310* 297* 196* 130* 88*  ALT 316* 305* 236* 183* 128*  ALKPHOS 157* 180* 145* 138* 133*  BILITOT 2.4* 3.8* 2.3* 2.2* 2.7*  PROT 6.5 5.8* 5.1* 4.9* 6.0*  ALBUMIN 3.6 3.5 2.9* 2.7* 4.0   No results for input(s): "LIPASE", "AMYLASE" in the last 168 hours. Recent Labs  Lab 04/22/22 1355  AMMONIA 40*   Coagulation profile Recent Labs  Lab 04/24/22 1220  INR 1.9*    CBC: Recent Labs  Lab 04/22/22 1354 04/23/22 0823 04/24/22 0501 04/25/22 0517 04/26/22 0518  WBC 7.4 7.6 7.4 6.5 6.1  NEUTROABS 6.7  --   --   --   --   HGB 13.0 12.2* 11.4* 11.1* 10.8*  HCT 37.5* 35.0* 33.4* 31.5* 31.2*  MCV 81.5 79.2* 80.7 78.8* 79.8*  PLT 129* 119* 106* 90* 82*   Cardiac Enzymes: No results for input(s): "CKTOTAL", "CKMB", "CKMBINDEX", "TROPONINI" in the last 168 hours. BNP: Invalid input(s): "POCBNP" CBG: Recent Labs  Lab 04/22/22 1351  GLUCAP 111*   D-Dimer No results for input(s): "DDIMER" in the last 72 hours. Hgb A1c No results for input(s): "HGBA1C" in the last 72 hours. Lipid Profile No results for input(s): "CHOL", "HDL", "LDLCALC", "TRIG", "CHOLHDL",  "LDLDIRECT" in the last 72 hours. Thyroid function studies No results for input(s): "TSH", "T4TOTAL", "T3FREE", "THYROIDAB" in the last 72 hours.  Invalid input(s): "FREET3" Anemia work up Recent Labs    04/24/22 1220  FERRITIN 38  TIBC 322  IRON 26*   Microbiology Recent Results (from the past 240 hour(s))  Resp panel by RT-PCR (RSV, Flu A&B, Covid) Anterior Nasal Swab     Status: None   Collection Time: 04/22/22 11:25 AM   Specimen: Anterior Nasal Swab  Result Value Ref Range Status   SARS Coronavirus 2 by RT PCR NEGATIVE NEGATIVE Final    Comment: (NOTE) SARS-CoV-2 target nucleic acids are NOT DETECTED.  The SARS-CoV-2 RNA is generally detectable in upper respiratory specimens during the acute phase of infection. The lowest concentration of SARS-CoV-2 viral copies this assay can detect is 138 copies/mL. A negative result does not preclude SARS-Cov-2 infection and should not be used as the sole basis for treatment or other patient management decisions. A negative result may occur with  improper specimen collection/handling, submission of specimen other than nasopharyngeal swab, presence of viral mutation(s) within the areas targeted by this assay, and inadequate number of viral copies(<138 copies/mL). A negative result must be  combined with clinical observations, patient history, and epidemiological information. The expected result is Negative.  Fact Sheet for Patients:  EntrepreneurPulse.com.au  Fact Sheet for Healthcare Providers:  IncredibleEmployment.be  This test is no t yet approved or cleared by the [place] FDA and  has been authorized for detection and/or diagnosis of SARS-CoV-2 by FDA under an Emergency Use Authorization (EUA). This EUA will remain  in effect (meaning this test can be used) for the duration of the COVID-19 declaration under Section 564(b)(1) of the Act, 21 U.S.C.section 360bbb-3(b)(1), unless the  authorization is terminated  or revoked sooner.       Influenza A by PCR NEGATIVE NEGATIVE Final   Influenza B by PCR NEGATIVE NEGATIVE Final    Comment: (NOTE) The Xpert Xpress SARS-CoV-2/FLU/RSV plus assay is intended as an aid in the diagnosis of influenza from Nasopharyngeal swab specimens and should not be used as a sole basis for treatment. Nasal washings and aspirates are unacceptable for Xpert Xpress SARS-CoV-2/FLU/RSV testing.  Fact Sheet for Patients: EntrepreneurPulse.com.au  Fact Sheet for Healthcare Providers: IncredibleEmployment.be  This test is not yet approved or cleared by the [place] FDA and has been authorized for detection and/or diagnosis of SARS-CoV-2 by FDA under an Emergency Use Authorization (EUA). This EUA will remain in effect (meaning this test can be used) for the duration of the COVID-19 declaration under Section 564(b)(1) of the Act, 21 U.S.C. section 360bbb-3(b)(1), unless the authorization is terminated or revoked.     Resp Syncytial Virus by PCR NEGATIVE NEGATIVE Final    Comment: (NOTE) Fact Sheet for Patients: EntrepreneurPulse.com.au  Fact Sheet for Healthcare Providers: IncredibleEmployment.be  This test is not yet approved or cleared by the [place] FDA and has been authorized for detection and/or diagnosis of SARS-CoV-2 by FDA under an Emergency Use Authorization (EUA). This EUA will remain in effect (meaning this test can be used) for the duration of the COVID-19 declaration under Section 564(b)(1) of the Act, 21 U.S.C. section 360bbb-3(b)(1), unless the authorization is terminated or revoked.  Performed at [facility], [address] [place], [place] [address]   Blood culture (routine x 2)     Status: None (Preliminary result)   Collection Time: 04/23/22  8:21 AM   Specimen: BLOOD  Result Value Ref Range Status   Specimen  Description   Final    BLOOD RIGHT ANTECUBITAL Performed at [facility], [address], [place], [place] [address]    Special Requests   Final    Blood Culture adequate volume BOTTLES DRAWN AEROBIC AND ANAEROBIC Performed at [facility], [address] [place], [place] [address]    Culture   Final    NO GROWTH 3 DAYS Performed at [facility], [place] [address], [place], [place] [address]    Report Status PENDING  Incomplete  Blood culture (routine x 2)     Status: None (Preliminary result)   Collection Time: 04/23/22  8:21 AM   Specimen: BLOOD  Result Value Ref Range Status   Specimen Description   Final    BLOOD BLOOD LEFT ARM Performed at [facility], [address], [place], [place] [address]    Special Requests   Final    BOTTLES DRAWN AEROBIC AND ANAEROBIC Blood Culture adequate volume Performed at [facility], [address], [place], [place] [address]    Culture   Final    NO GROWTH 3 DAYS Performed at [facility], [place]  [address] [place], [place] [address]    Report Status PENDING  Incomplete     Discharge Instructions:   Discharge Instructions     Diet general   Complete by: As directed    Discharge instructions   Complete by: As directed    Follow-up with hospice care provider as outpatient.  Take medications as prescribed.   Increase activity slowly   Complete by: As directed       Allergies as of 04/26/2022       Reactions   Crestor [rosuvastatin Calcium] Other (See Comments)   myalgia   Lipitor [atorvastatin Calcium] Other (See Comments)   myalgia   Trazodone And Nefazodone Other (See Comments)   Constipation        Medication List     STOP taking these medications    doxycycline 100 MG tablet Commonly known as: VIBRA-TABS   predniSONE 20 MG tablet Commonly known as: DELTASONE   SLEEP AID PO   zaleplon 5 MG capsule Commonly known as:  SONATA       TAKE these medications    aspirin EC 81 MG tablet Take 1 tablet (81 mg total) by mouth daily. What changed: when to take this   clopidogrel 75 MG tablet Commonly known as: PLAVIX Take 1 tablet (75 mg total) by mouth daily for 21 days. Start taking on: April 27, 2022   furosemide 20 MG tablet Commonly known as: Lasix Take 1 tablet (20 mg total) by mouth every morning.   lactulose 10 GM/15ML solution Commonly known as: CHRONULAC Take 30 mLs (20 g total) by mouth 2 (two) times daily as needed for mild constipation.   LORazepam 0.5 MG tablet Commonly known as: Ativan Take 1 tablet (0.5 mg total) by mouth every 8 (eight) hours as needed for anxiety.         Time coordinating discharge: 39 minutes  Signed:  Olyn Landstrom  [facility] 04/26/2022, 11:03 AM

## 2022-04-26 NOTE — Progress Notes (Signed)
[id number] Liaison Note   Received request from [facility] [place], for hospice services at home after discharge.    Spoke with patient's son, Trigg to initiate education related to hospice philosophy, services, and team approach to care. Briar verbalized understanding of information given. Per discussion, the plan is for patient to discharge home via private car today.    DME needs discussed. Patient has the following equipment in the home (Purchased privately): Walker Patient requests the following equipment for delivery: Electronics engineer  Address verified and is correct in the chart. Obrien is the family member to contact to arrange time of equipment delivery.    Please send signed and completed DNR home with patient/family. Please provide prescriptions at discharge as needed to ensure ongoing symptom management.    [facility] information and contact numbers given to family & above information shared with TOC.   Please call with any questions/concerns.    Thank you for the opportunity to participate in this patient's care.   Zigmund Gottron  [facility] Liaison  [phone]

## 2022-04-26 NOTE — TOC Transition Note (Addendum)
Transition of Care [facility]) - CM/SW Discharge Note   Patient Details  Name: Dwayne [name] MRN: [id number] Date of Birth: Jul 11, 1934  Transition of [facility] CM/SW Contact:  Leeroy Cha, RN Phone Number: 04/26/2022, 1:55 PM   Clinical Narrative:    Patient to be dcd to home with hospice . Nicholaus Corolla Notified.by family earlier in the day.   Final next level of care: Home w Hospice Care Barriers to Discharge: Barriers Resolved   Patient Goals and CMS Choice CMS Medicare.gov Compare Post Acute Care list provided to:: Other (Comment Required) (son Dwayne [name]) Choice offered to / list presented to : Adult Children  Discharge Placement                         Discharge Plan and Services Additional resources added to the After Visit Summary for       Post Acute Care Choice: Hospice                               Social Determinants of Health (SDOH) Interventions SDOH Screenings   Food Insecurity: No Food Insecurity (04/23/2022)  Housing: Low Risk  (04/23/2022)  Transportation Needs: No Transportation Needs (04/23/2022)  Utilities: Not At Risk (04/23/2022)  Tobacco Use: High Risk (04/22/2022)     Readmission Risk Interventions   No data to display

## 2022-04-26 NOTE — Plan of Care (Signed)
  Problem: Clinical Measurements: Goal: Ability to maintain clinical measurements within normal limits will improve Outcome: Progressing Goal: Respiratory complications will improve Outcome: Progressing   Problem: Activity: Goal: Risk for activity intolerance will decrease Outcome: Progressing   Problem: Safety: Goal: Ability to remain free from injury will improve Outcome: Progressing

## 2022-04-26 NOTE — Progress Notes (Signed)
Palliative:  Consult received and chart reviewed. Patient already connected to hospice services. Code status has been addressed. Discussed with Dr. Louanne Belton - no acute palliative needs at this time.  Please reach out to PMT if needs arise.  Juel Burrow, DNP, AGNP-C Palliative Medicine Team Team Phone # [phone]  Pager # [phone]  NO CHARGE

## 2022-04-28 ENCOUNTER — Encounter: Payer: Self-pay | Admitting: Pulmonary Disease

## 2022-04-28 LAB — CULTURE, BLOOD (ROUTINE X 2)
Culture: NO GROWTH
Culture: NO GROWTH
Special Requests: ADEQUATE
Special Requests: ADEQUATE

## 2022-04-30 NOTE — Telephone Encounter (Signed)
Received the following message from patient:   "Good morning!   We just wanted to touch base with Dr. Lake Bells.  Dwayne [name] was recently hospitalized and diagnosed with CHF, liver failure and kidney disease.  He also has had a small stroke.  Most all of these issues have stemmed from his Aortic Stenosis  The physician team informed us that there is nothing they can do at this point and he was discharged home with hospice.   Do we need to keep the follow up appointment with Dr. Lake Bells?"   Patient is scheduled for a F/U on 05/15/22 at 315pm.   BQ, please advise if he needs to keep this appointment.

## 2022-05-07 ENCOUNTER — Ambulatory Visit: Payer: Medicare Other | Admitting: Cardiology

## 2022-05-15 ENCOUNTER — Ambulatory Visit: Payer: Medicare Other | Admitting: Pulmonary Disease

## 2022-06-08 DEATH — deceased
